# Patient Record
Sex: Male | Born: 1940 | Race: White | Hispanic: No | Marital: Married
Health system: Southern US, Community
[De-identification: ages and names within clinical notes are randomized; demographics above are authoritative.]

## PROBLEM LIST (undated history)

## (undated) DIAGNOSIS — I10 Essential (primary) hypertension: Secondary | ICD-10-CM

## (undated) DIAGNOSIS — E119 Type 2 diabetes mellitus without complications: Secondary | ICD-10-CM

---

## 2007-01-24 ENCOUNTER — Emergency Department: Payer: Self-pay | Admitting: Emergency Medicine

## 2007-01-24 ENCOUNTER — Inpatient Hospital Stay: Payer: Self-pay | Admitting: Internal Medicine

## 2007-01-29 ENCOUNTER — Ambulatory Visit: Payer: Self-pay | Admitting: Surgery

## 2007-02-02 ENCOUNTER — Inpatient Hospital Stay: Payer: Self-pay | Admitting: Internal Medicine

## 2007-02-20 ENCOUNTER — Other Ambulatory Visit: Payer: Self-pay

## 2007-02-20 ENCOUNTER — Inpatient Hospital Stay: Payer: Self-pay | Admitting: Internal Medicine

## 2007-03-24 ENCOUNTER — Ambulatory Visit: Payer: Self-pay | Admitting: Internal Medicine

## 2007-03-31 ENCOUNTER — Ambulatory Visit: Payer: Self-pay | Admitting: General Surgery

## 2007-04-11 ENCOUNTER — Ambulatory Visit: Payer: Self-pay | Admitting: Internal Medicine

## 2007-05-12 ENCOUNTER — Ambulatory Visit: Payer: Self-pay | Admitting: Internal Medicine

## 2007-06-11 ENCOUNTER — Ambulatory Visit: Payer: Self-pay | Admitting: Internal Medicine

## 2007-07-12 ENCOUNTER — Ambulatory Visit: Payer: Self-pay | Admitting: Internal Medicine

## 2007-07-28 ENCOUNTER — Ambulatory Visit: Payer: Self-pay | Admitting: Internal Medicine

## 2007-08-11 ENCOUNTER — Ambulatory Visit: Payer: Self-pay | Admitting: Internal Medicine

## 2007-09-11 ENCOUNTER — Ambulatory Visit: Payer: Self-pay | Admitting: Internal Medicine

## 2007-12-10 ENCOUNTER — Ambulatory Visit: Payer: Self-pay | Admitting: Internal Medicine

## 2008-01-09 ENCOUNTER — Ambulatory Visit: Payer: Self-pay | Admitting: Internal Medicine

## 2008-08-13 IMAGING — CR DG ABDOMEN 3V
1 series · 5 of 5 positions shown · non-contrast
Comparison: none

REASON FOR EXAM: SBO
COMMENTS:

PROCEDURE:     DXR - DXR ABDOMEN 3-WAY (INCL PA CXR)  - January 25, 2007  [DATE]
RESULT:     Comparison to prior report of chest x-ray of 10/05/2000.

[Series 1: view not recorded · 0.17mm/px · 5 of 5 slices shown]
[im 1/5]
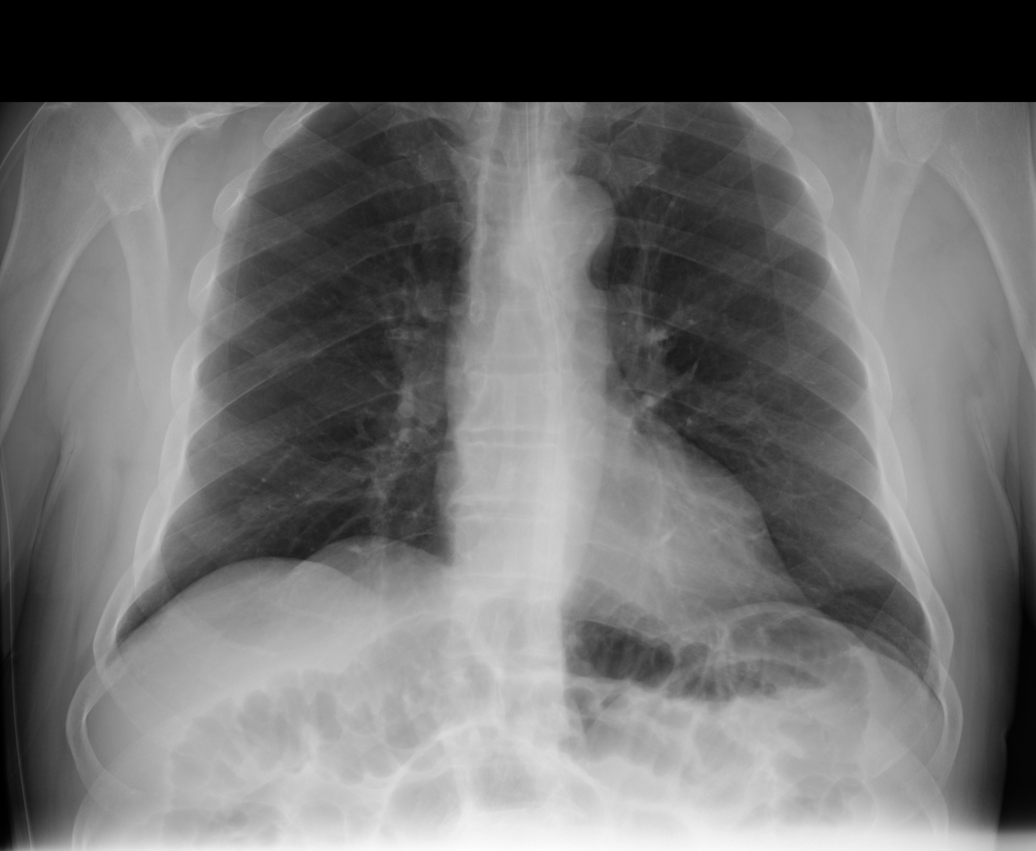
[im 2/5]
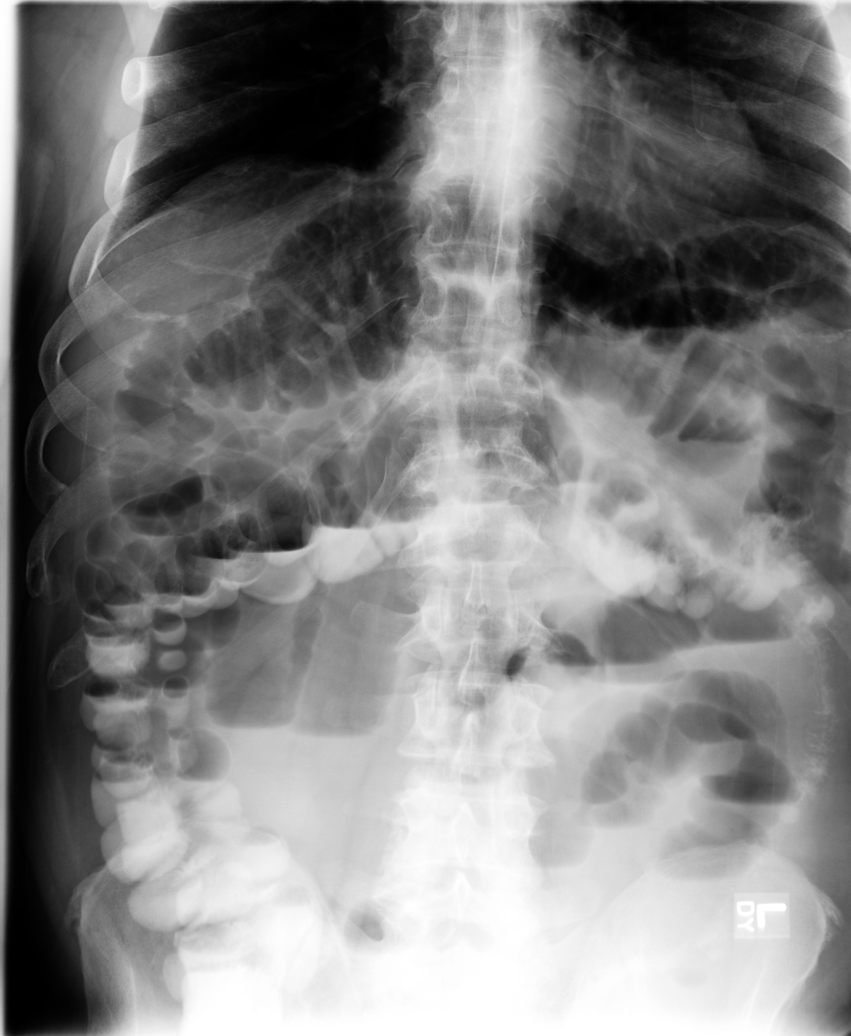
[im 3/5]
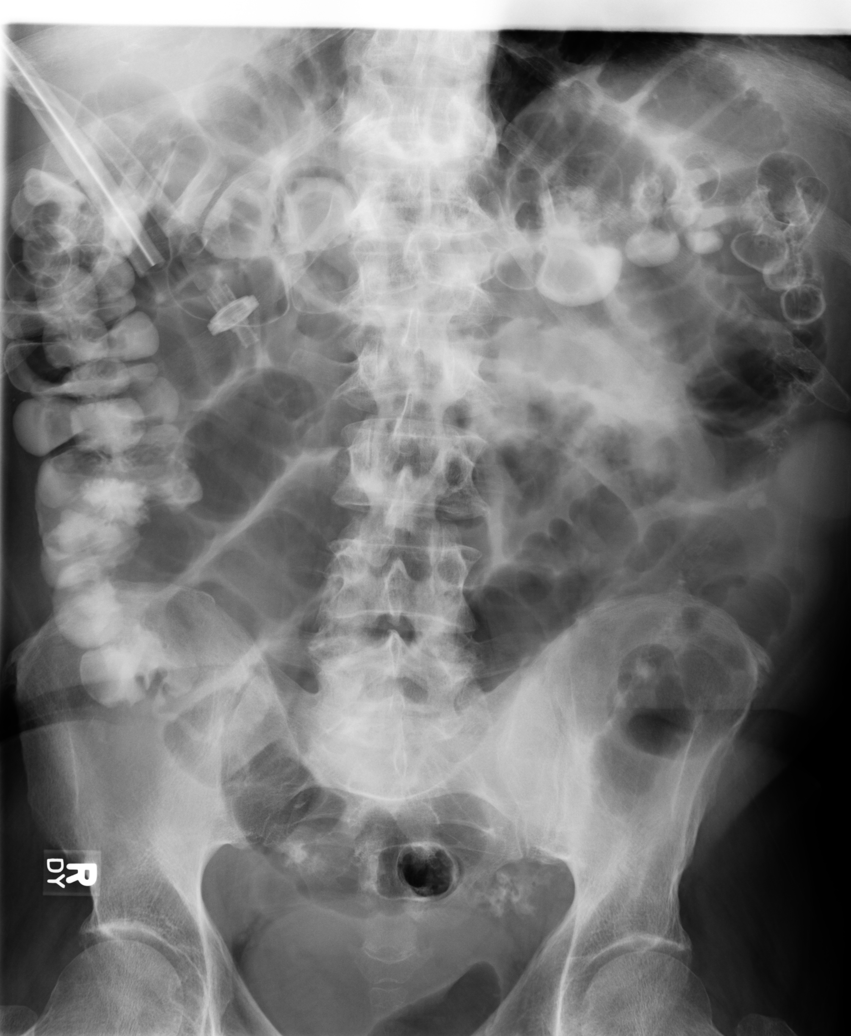
[im 4/5]
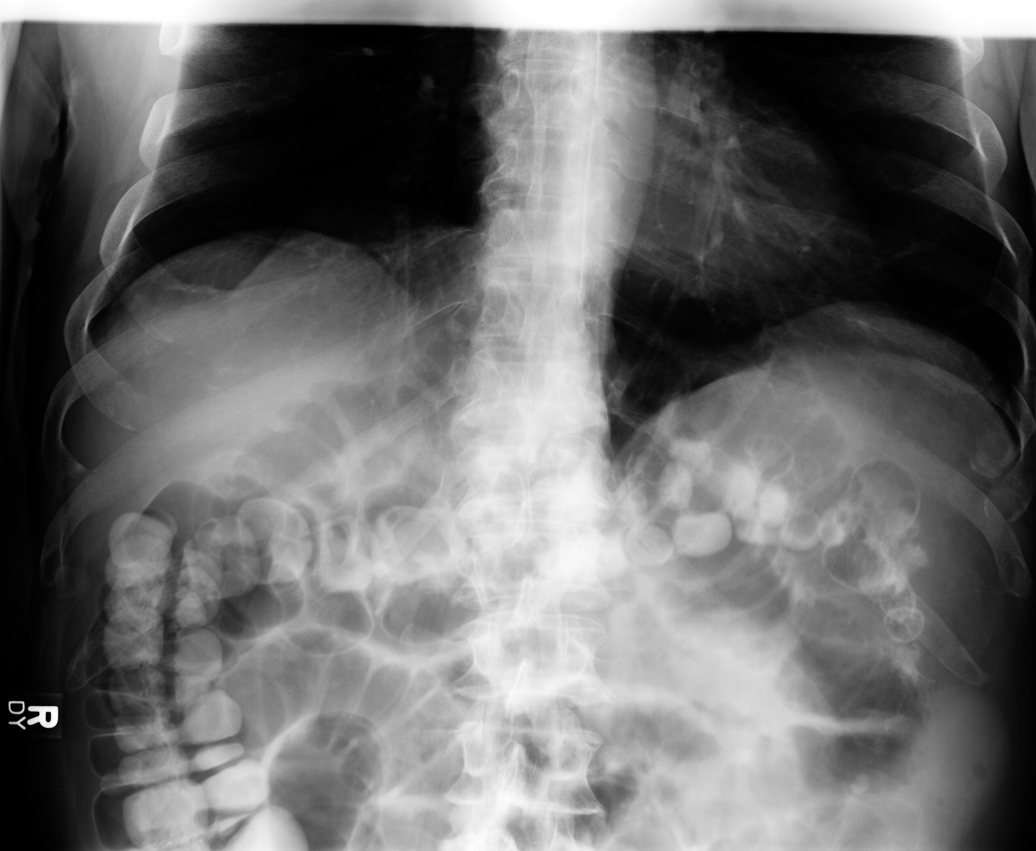
[im 5/5]
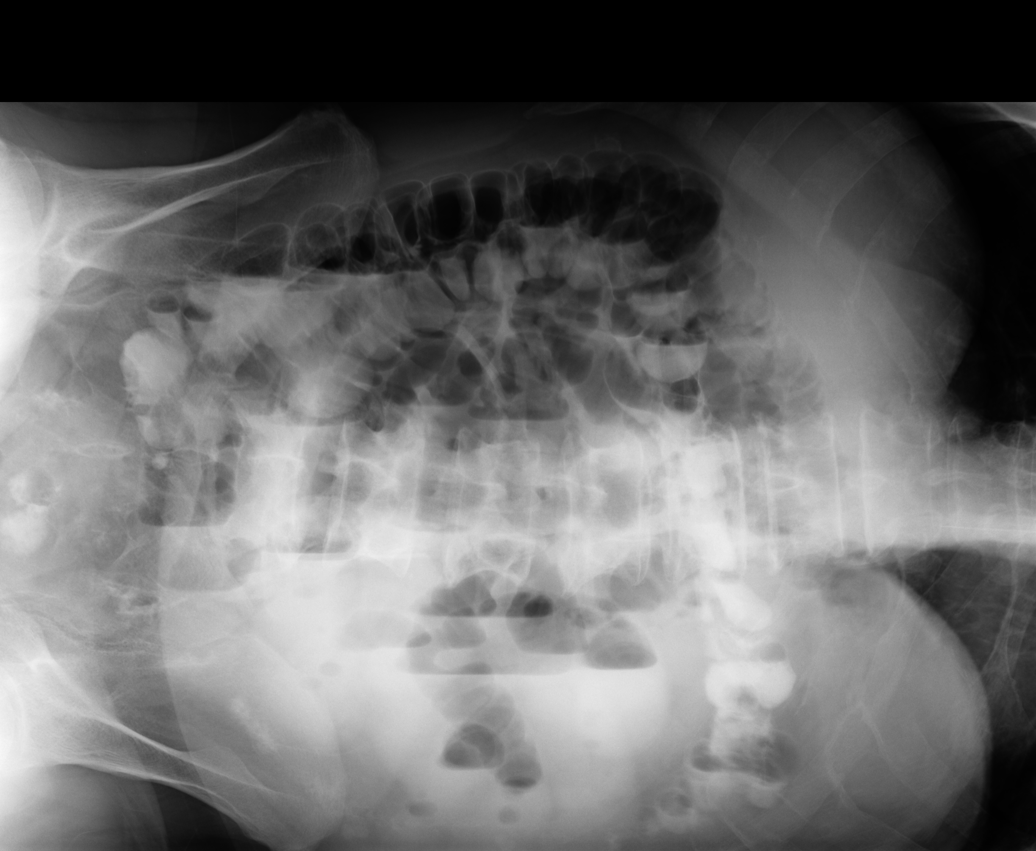

[5 of 5 positions shown; findings below may reference images not displayed]

FINDINGS: The lungs are clear. Tiny mediastinal silhouette is unremarkable.
An NG tube extends into the stomach. There is no pneumothorax. There are
multiple loops of dilated small bowel with relatively decompressed and large
bowel. Air-fluid levels are seen on upright film. There is no evidence of
free air.
IMPRESSION: Findings consistent with persistent small bowel obstruction.

## 2008-12-09 ENCOUNTER — Ambulatory Visit: Payer: Self-pay | Admitting: Vascular Surgery

## 2009-01-12 ENCOUNTER — Ambulatory Visit: Payer: Self-pay | Admitting: Vascular Surgery

## 2009-07-08 ENCOUNTER — Inpatient Hospital Stay: Payer: Self-pay | Admitting: Internal Medicine

## 2010-02-06 ENCOUNTER — Inpatient Hospital Stay: Payer: Self-pay | Admitting: Internal Medicine

## 2010-05-11 ENCOUNTER — Ambulatory Visit: Payer: Self-pay | Admitting: Internal Medicine

## 2010-05-17 ENCOUNTER — Inpatient Hospital Stay: Payer: Self-pay | Admitting: Internal Medicine

## 2010-05-25 ENCOUNTER — Ambulatory Visit: Payer: Self-pay | Admitting: Internal Medicine

## 2010-06-10 ENCOUNTER — Ambulatory Visit: Payer: Self-pay | Admitting: Internal Medicine

## 2010-06-26 ENCOUNTER — Ambulatory Visit: Payer: Self-pay | Admitting: Gastroenterology

## 2010-06-29 LAB — PATHOLOGY REPORT

## 2010-08-28 ENCOUNTER — Other Ambulatory Visit: Payer: Self-pay

## 2011-04-15 ENCOUNTER — Inpatient Hospital Stay: Payer: Self-pay | Admitting: *Deleted

## 2012-04-01 ENCOUNTER — Ambulatory Visit: Payer: Self-pay | Admitting: Emergency Medicine

## 2012-04-01 DIAGNOSIS — I2510 Atherosclerotic heart disease of native coronary artery without angina pectoris: Secondary | ICD-10-CM

## 2012-04-01 LAB — HEPATIC FUNCTION PANEL A (ARMC)
Bilirubin,Total: 0.7 mg/dL (ref 0.2–1.0)
SGOT(AST): 21 U/L (ref 15–37)
SGPT (ALT): 20 U/L
Total Protein: 7.1 g/dL (ref 6.4–8.2)

## 2012-04-01 LAB — CBC WITH DIFFERENTIAL/PLATELET
Basophil %: 1.1 %
Eosinophil #: 0.1 10*3/uL (ref 0.0–0.7)
Eosinophil %: 0.8 %
HCT: 48.2 % (ref 40.0–52.0)
HGB: 15.6 g/dL (ref 13.0–18.0)
Lymphocyte %: 15 %
MCHC: 32.4 g/dL (ref 32.0–36.0)
Monocyte %: 10 %
Neutrophil #: 5.9 10*3/uL (ref 1.4–6.5)
Neutrophil %: 73.1 %
RBC: 5.22 10*6/uL (ref 4.40–5.90)
RDW: 15.1 % — ABNORMAL HIGH (ref 11.5–14.5)

## 2012-04-01 LAB — BASIC METABOLIC PANEL
Anion Gap: 9 (ref 7–16)
BUN: 21 mg/dL — ABNORMAL HIGH (ref 7–18)
Creatinine: 1.35 mg/dL — ABNORMAL HIGH (ref 0.60–1.30)
Glucose: 100 mg/dL — ABNORMAL HIGH (ref 65–99)
Osmolality: 273 (ref 275–301)
Potassium: 4.1 mmol/L (ref 3.5–5.1)
Sodium: 135 mmol/L — ABNORMAL LOW (ref 136–145)

## 2012-04-08 ENCOUNTER — Inpatient Hospital Stay: Payer: Self-pay | Admitting: Emergency Medicine

## 2012-04-08 LAB — PROTIME-INR: INR: 0.9

## 2012-04-11 LAB — PATHOLOGY REPORT

## 2013-09-22 ENCOUNTER — Encounter (HOSPITAL_COMMUNITY)
Admission: EM | Disposition: A | Discharge status: Hospice | Payer: Self-pay | Source: Home / Self Care | Attending: Neurology

## 2013-09-22 ENCOUNTER — Encounter (HOSPITAL_COMMUNITY): Payer: Medicare Other | Admitting: Certified Registered Nurse Anesthetist

## 2013-09-22 ENCOUNTER — Emergency Department (HOSPITAL_COMMUNITY): Payer: Medicare Other

## 2013-09-22 ENCOUNTER — Inpatient Hospital Stay (HOSPITAL_COMMUNITY): Payer: Medicare Other

## 2013-09-22 ENCOUNTER — Inpatient Hospital Stay (HOSPITAL_COMMUNITY)
Admission: EM | Admit: 2013-09-22 | Discharge: 2013-09-26 | DRG: 023 | Disposition: A | Discharge status: Hospice | Payer: Medicare Other | Attending: Neurology | Admitting: Neurology

## 2013-09-22 ENCOUNTER — Encounter (HOSPITAL_COMMUNITY): Payer: Self-pay | Admitting: Emergency Medicine

## 2013-09-22 ENCOUNTER — Encounter (HOSPITAL_COMMUNITY): Payer: Self-pay | Admitting: Certified Registered"

## 2013-09-22 ENCOUNTER — Inpatient Hospital Stay (HOSPITAL_COMMUNITY): Payer: Medicare Other | Admitting: Certified Registered Nurse Anesthetist

## 2013-09-22 DIAGNOSIS — I639 Cerebral infarction, unspecified: Secondary | ICD-10-CM | POA: Diagnosis present

## 2013-09-22 DIAGNOSIS — I6350 Cerebral infarction due to unspecified occlusion or stenosis of unspecified cerebral artery: Secondary | ICD-10-CM

## 2013-09-22 DIAGNOSIS — I4891 Unspecified atrial fibrillation: Secondary | ICD-10-CM | POA: Diagnosis present

## 2013-09-22 DIAGNOSIS — Z66 Do not resuscitate: Secondary | ICD-10-CM | POA: Diagnosis not present

## 2013-09-22 DIAGNOSIS — R4182 Altered mental status, unspecified: Secondary | ICD-10-CM | POA: Diagnosis present

## 2013-09-22 DIAGNOSIS — T4275XA Adverse effect of unspecified antiepileptic and sedative-hypnotic drugs, initial encounter: Secondary | ICD-10-CM | POA: Diagnosis not present

## 2013-09-22 DIAGNOSIS — J438 Other emphysema: Secondary | ICD-10-CM | POA: Diagnosis present

## 2013-09-22 DIAGNOSIS — R4789 Other speech disturbances: Secondary | ICD-10-CM | POA: Diagnosis present

## 2013-09-22 DIAGNOSIS — E871 Hypo-osmolality and hyponatremia: Secondary | ICD-10-CM | POA: Diagnosis not present

## 2013-09-22 DIAGNOSIS — Z7982 Long term (current) use of aspirin: Secondary | ICD-10-CM

## 2013-09-22 DIAGNOSIS — Z79899 Other long term (current) drug therapy: Secondary | ICD-10-CM

## 2013-09-22 DIAGNOSIS — E119 Type 2 diabetes mellitus without complications: Secondary | ICD-10-CM | POA: Diagnosis present

## 2013-09-22 DIAGNOSIS — E785 Hyperlipidemia, unspecified: Secondary | ICD-10-CM | POA: Diagnosis present

## 2013-09-22 DIAGNOSIS — E870 Hyperosmolality and hypernatremia: Secondary | ICD-10-CM | POA: Diagnosis not present

## 2013-09-22 DIAGNOSIS — I10 Essential (primary) hypertension: Secondary | ICD-10-CM | POA: Diagnosis present

## 2013-09-22 DIAGNOSIS — Z515 Encounter for palliative care: Secondary | ICD-10-CM

## 2013-09-22 DIAGNOSIS — J9600 Acute respiratory failure, unspecified whether with hypoxia or hypercapnia: Secondary | ICD-10-CM | POA: Diagnosis present

## 2013-09-22 DIAGNOSIS — I6340 Cerebral infarction due to embolism of unspecified cerebral artery: Principal | ICD-10-CM | POA: Diagnosis present

## 2013-09-22 DIAGNOSIS — I63239 Cerebral infarction due to unspecified occlusion or stenosis of unspecified carotid arteries: Secondary | ICD-10-CM | POA: Diagnosis present

## 2013-09-22 DIAGNOSIS — R414 Neurologic neglect syndrome: Secondary | ICD-10-CM | POA: Diagnosis present

## 2013-09-22 DIAGNOSIS — G8190 Hemiplegia, unspecified affecting unspecified side: Secondary | ICD-10-CM | POA: Diagnosis present

## 2013-09-22 DIAGNOSIS — G936 Cerebral edema: Secondary | ICD-10-CM | POA: Diagnosis present

## 2013-09-22 HISTORY — DX: Type 2 diabetes mellitus without complications: E11.9

## 2013-09-22 HISTORY — PX: RADIOLOGY WITH ANESTHESIA: SHX6223

## 2013-09-22 HISTORY — DX: Essential (primary) hypertension: I10

## 2013-09-22 LAB — URINALYSIS, ROUTINE W REFLEX MICROSCOPIC
Bilirubin Urine: NEGATIVE
Glucose, UA: NEGATIVE mg/dL
Hgb urine dipstick: NEGATIVE
Ketones, ur: NEGATIVE mg/dL
LEUKOCYTES UA: NEGATIVE
Nitrite: NEGATIVE
PROTEIN: 100 mg/dL — AB
Specific Gravity, Urine: 1.015 (ref 1.005–1.030)
UROBILINOGEN UA: 1 mg/dL (ref 0.0–1.0)
pH: 6.5 (ref 5.0–8.0)

## 2013-09-22 LAB — RAPID URINE DRUG SCREEN, HOSP PERFORMED
Amphetamines: NOT DETECTED
BARBITURATES: NOT DETECTED
Benzodiazepines: NOT DETECTED
Cocaine: NOT DETECTED
Opiates: NOT DETECTED
TETRAHYDROCANNABINOL: NOT DETECTED

## 2013-09-22 LAB — POCT I-STAT, CHEM 8
BUN: 11 mg/dL (ref 6–23)
CALCIUM ION: 1.2 mmol/L (ref 1.13–1.30)
Chloride: 98 mEq/L (ref 96–112)
Creatinine, Ser: 1.2 mg/dL (ref 0.50–1.35)
GLUCOSE: 112 mg/dL — AB (ref 70–99)
HEMATOCRIT: 52 % (ref 39.0–52.0)
Hemoglobin: 17.7 g/dL — ABNORMAL HIGH (ref 13.0–17.0)
Potassium: 4.3 mEq/L (ref 3.7–5.3)
Sodium: 135 mEq/L — ABNORMAL LOW (ref 137–147)
TCO2: 28 mmol/L (ref 0–100)

## 2013-09-22 LAB — CBC
HCT: 46.8 % (ref 39.0–52.0)
Hemoglobin: 16.6 g/dL (ref 13.0–17.0)
MCH: 31.3 pg (ref 26.0–34.0)
MCHC: 35.5 g/dL (ref 30.0–36.0)
MCV: 88.1 fL (ref 78.0–100.0)
PLATELETS: 259 10*3/uL (ref 150–400)
RBC: 5.31 MIL/uL (ref 4.22–5.81)
RDW: 15 % (ref 11.5–15.5)
WBC: 9.7 10*3/uL (ref 4.0–10.5)

## 2013-09-22 LAB — COMPREHENSIVE METABOLIC PANEL
ALBUMIN: 3.8 g/dL (ref 3.5–5.2)
ALK PHOS: 82 U/L (ref 39–117)
ALT: 9 U/L (ref 0–53)
AST: 18 U/L (ref 0–37)
BILIRUBIN TOTAL: 0.4 mg/dL (ref 0.3–1.2)
BUN: 11 mg/dL (ref 6–23)
CO2: 28 mEq/L (ref 19–32)
Calcium: 9.5 mg/dL (ref 8.4–10.5)
Chloride: 95 mEq/L — ABNORMAL LOW (ref 96–112)
Creatinine, Ser: 1.03 mg/dL (ref 0.50–1.35)
GFR calc Af Amer: 81 mL/min — ABNORMAL LOW (ref >90)
GFR calc non Af Amer: 70 mL/min — ABNORMAL LOW (ref >90)
Glucose, Bld: 111 mg/dL — ABNORMAL HIGH (ref 70–99)
POTASSIUM: 4.4 meq/L (ref 3.7–5.3)
SODIUM: 133 meq/L — AB (ref 137–147)
TOTAL PROTEIN: 7.1 g/dL (ref 6.0–8.3)

## 2013-09-22 LAB — BLOOD GAS, ARTERIAL
Acid-base deficit: 3.8 mmol/L — ABNORMAL HIGH (ref 0.0–2.0)
Bicarbonate: 20.5 mEq/L (ref 20.0–24.0)
Drawn by: 36274
FIO2: 0.4 %
LHR: 14 {breaths}/min
MECHVT: 560 mL
O2 Saturation: 96.7 %
PATIENT TEMPERATURE: 98.6
PEEP: 5 cmH2O
TCO2: 21.5 mmol/L (ref 0–100)
pCO2 arterial: 35.7 mmHg (ref 35.0–45.0)
pH, Arterial: 7.376 (ref 7.350–7.450)
pO2, Arterial: 87.6 mmHg (ref 80.0–100.0)

## 2013-09-22 LAB — DIFFERENTIAL
BASOS PCT: 1 % (ref 0–1)
Basophils Absolute: 0.1 10*3/uL (ref 0.0–0.1)
Eosinophils Absolute: 0.1 10*3/uL (ref 0.0–0.7)
Eosinophils Relative: 1 % (ref 0–5)
Lymphocytes Relative: 28 % (ref 12–46)
Lymphs Abs: 2.7 10*3/uL (ref 0.7–4.0)
Monocytes Absolute: 1.1 10*3/uL — ABNORMAL HIGH (ref 0.1–1.0)
Monocytes Relative: 11 % (ref 3–12)
NEUTROS PCT: 60 % (ref 43–77)
Neutro Abs: 5.8 10*3/uL (ref 1.7–7.7)

## 2013-09-22 LAB — PROTIME-INR
INR: 0.98 (ref 0.00–1.49)
Prothrombin Time: 12.8 seconds (ref 11.6–15.2)

## 2013-09-22 LAB — GLUCOSE, CAPILLARY
GLUCOSE-CAPILLARY: 110 mg/dL — AB (ref 70–99)
Glucose-Capillary: 136 mg/dL — ABNORMAL HIGH (ref 70–99)

## 2013-09-22 LAB — ETHANOL: Alcohol, Ethyl (B): <11 mg/dL (ref 0–11)

## 2013-09-22 LAB — MRSA PCR SCREENING: MRSA BY PCR: NEGATIVE

## 2013-09-22 LAB — URINE MICROSCOPIC-ADD ON

## 2013-09-22 LAB — TROPONIN I: Troponin I: <0.3 ng/mL (ref <0.30)

## 2013-09-22 LAB — POCT I-STAT TROPONIN I: TROPONIN I, POC: 0.01 ng/mL (ref 0.00–0.08)

## 2013-09-22 LAB — APTT: APTT: 29 s (ref 24–37)

## 2013-09-22 SURGERY — RADIOLOGY WITH ANESTHESIA
Anesthesia: General

## 2013-09-22 MED ORDER — ROCURONIUM BROMIDE 100 MG/10ML IV SOLN
INTRAVENOUS | Status: DC | PRN
  Administered 2013-09-22: 20 mg via INTRAVENOUS
  Administered 2013-09-22: 50 mg via INTRAVENOUS
  Administered 2013-09-22: 30 mg via INTRAVENOUS

## 2013-09-22 MED ORDER — ACETAMINOPHEN 325 MG PO TABS: 650.0000 mg | ORAL_TABLET | ORAL | Status: DC | PRN

## 2013-09-22 MED ORDER — PHENYLEPHRINE HCL 10 MG/ML IJ SOLN
INTRAMUSCULAR | Status: DC | PRN
  Administered 2013-09-22: 40 ug via INTRAVENOUS

## 2013-09-22 MED ORDER — SODIUM CHLORIDE 0.9 % IV SOLN
INTRAVENOUS | Status: DC | PRN
  Administered 2013-09-22: 18:00:00 via INTRAVENOUS

## 2013-09-22 MED ORDER — ACETAMINOPHEN 650 MG RE SUPP: 650.0000 mg | RECTAL | Status: DC | PRN

## 2013-09-22 MED ORDER — PHENYLEPHRINE HCL 10 MG/ML IJ SOLN
10.0000 mg | INTRAVENOUS | Status: DC | PRN
  Administered 2013-09-22: 10 ug/min via INTRAVENOUS

## 2013-09-22 MED ORDER — FENTANYL CITRATE 0.05 MG/ML IJ SOLN
INTRAMUSCULAR | Status: DC | PRN
  Administered 2013-09-22: 25 ug via INTRAVENOUS

## 2013-09-22 MED ORDER — ACETAMINOPHEN 650 MG RE SUPP: 650.0000 mg | Freq: Four times a day (QID) | RECTAL | Status: DC | PRN

## 2013-09-22 MED ORDER — FENTANYL CITRATE 0.05 MG/ML IJ SOLN
25.0000 ug | INTRAMUSCULAR | Status: DC | PRN
  Administered 2013-09-22 – 2013-09-23 (×4): 50 ug via INTRAVENOUS
  Administered 2013-09-23: 100 ug via INTRAVENOUS
  Administered 2013-09-23: 50 ug via INTRAVENOUS
  Administered 2013-09-24: 100 ug via INTRAVENOUS
  Administered 2013-09-24: 50 ug via INTRAVENOUS
  Filled 2013-09-22 (×7): qty 2

## 2013-09-22 MED ORDER — EPHEDRINE SULFATE 50 MG/ML IJ SOLN
INTRAMUSCULAR | Status: DC | PRN
  Administered 2013-09-22: 5 mg via INTRAVENOUS

## 2013-09-22 MED ORDER — FENTANYL CITRATE 0.05 MG/ML IJ SOLN
INTRAMUSCULAR | Status: AC
  Filled 2013-09-22: qty 2

## 2013-09-22 MED ORDER — NICARDIPINE HCL IN NACL 20-0.86 MG/200ML-% IV SOLN: 5.0000 mg/h | INTRAVENOUS | Status: DC

## 2013-09-22 MED ORDER — FENTANYL CITRATE 0.05 MG/ML IJ SOLN
INTRAMUSCULAR | Status: DC | PRN
  Administered 2013-09-22: 100 ug via INTRAVENOUS
  Administered 2013-09-22: 50 ug via INTRAVENOUS
  Administered 2013-09-22: 100 ug via INTRAVENOUS

## 2013-09-22 MED ORDER — SODIUM CHLORIDE 0.9 % IV SOLN
INTRAVENOUS | Status: DC
  Administered 2013-09-22: 21:00:00 via INTRAVENOUS

## 2013-09-22 MED ORDER — MIDAZOLAM HCL 2 MG/2ML IJ SOLN
INTRAMUSCULAR | Status: AC
Start: 2013-09-22 — End: 2013-09-23
  Filled 2013-09-22: qty 2

## 2013-09-22 MED ORDER — LABETALOL HCL 5 MG/ML IV SOLN: 10.0000 mg | INTRAVENOUS | Status: DC | PRN

## 2013-09-22 MED ORDER — SODIUM CHLORIDE 0.9 % IJ SOLN
1.5000 mg | INTRAMUSCULAR | Status: AC
  Administered 2013-09-22: 25 ug via INTRA_ARTERIAL

## 2013-09-22 MED ORDER — SODIUM CHLORIDE 0.9 % IV SOLN: INTRAVENOUS | Status: DC

## 2013-09-22 MED ORDER — PANTOPRAZOLE SODIUM 40 MG IV SOLR
40.0000 mg | Freq: Every day | INTRAVENOUS | Status: DC
  Administered 2013-09-22 – 2013-09-23 (×2): 40 mg via INTRAVENOUS
  Filled 2013-09-22 (×4): qty 40

## 2013-09-22 MED ORDER — MIDAZOLAM HCL 2 MG/2ML IJ SOLN
INTRAMUSCULAR | Status: DC | PRN
  Administered 2013-09-22: 1 mg via INTRAVENOUS

## 2013-09-22 MED ORDER — LIDOCAINE HCL (CARDIAC) 20 MG/ML IV SOLN
INTRAVENOUS | Status: DC | PRN
  Administered 2013-09-22: 80 mg via INTRAVENOUS

## 2013-09-22 MED ORDER — BIOTENE DRY MOUTH MT LIQD
15.0000 mL | Freq: Two times a day (BID) | OROMUCOSAL | Status: DC
  Administered 2013-09-23 (×2): 15 mL via OROMUCOSAL

## 2013-09-22 MED ORDER — CHLORHEXIDINE GLUCONATE 0.12 % MT SOLN
15.0000 mL | Freq: Two times a day (BID) | OROMUCOSAL | Status: DC
  Administered 2013-09-23 (×3): 15 mL via OROMUCOSAL
  Filled 2013-09-22 (×3): qty 15

## 2013-09-22 MED ORDER — LABETALOL HCL 5 MG/ML IV SOLN
INTRAVENOUS | Status: AC
  Filled 2013-09-22: qty 4

## 2013-09-22 MED ORDER — ACETAMINOPHEN 500 MG PO TABS
1000.0000 mg | ORAL_TABLET | Freq: Four times a day (QID) | ORAL | Status: DC | PRN
Start: 2013-09-22 — End: 2013-09-24

## 2013-09-22 MED ORDER — PROPOFOL INFUSION 10 MG/ML OPTIME
INTRAVENOUS | Status: DC | PRN
  Administered 2013-09-22: 50 ug/kg/min via INTRAVENOUS

## 2013-09-22 MED ORDER — PROPOFOL 10 MG/ML IV BOLUS
INTRAVENOUS | Status: DC | PRN
  Administered 2013-09-22: 130 mg via INTRAVENOUS

## 2013-09-22 MED ORDER — IOHEXOL 300 MG/ML  SOLN
150.0000 mL | Freq: Once | INTRAMUSCULAR | Status: AC | PRN
  Administered 2013-09-22: 140 mL via INTRA_ARTERIAL

## 2013-09-22 MED ORDER — ALTEPLASE 30 MG/30 ML FOR INTERV. RAD
1.0000 mg | INTRA_ARTERIAL | Status: AC
  Administered 2013-09-22: 6.9 mg via INTRA_ARTERIAL
  Administered 2013-09-22: 2.5 mg via INTRA_ARTERIAL
  Administered 2013-09-22: 1 mg via INTRA_ARTERIAL
  Filled 2013-09-22: qty 30

## 2013-09-22 MED ORDER — STROKE: EARLY STAGES OF RECOVERY BOOK
Freq: Once | Status: AC
  Administered 2013-09-22: 23:00:00
  Filled 2013-09-22: qty 1

## 2013-09-22 MED ORDER — ALTEPLASE (STROKE) FULL DOSE INFUSION
0.9000 mg/kg | Freq: Once | INTRAVENOUS | Status: AC
  Administered 2013-09-22: 59 mg via INTRAVENOUS
  Filled 2013-09-22: qty 59

## 2013-09-22 MED ORDER — SODIUM CHLORIDE 0.9 % IV SOLN
Freq: Once | INTRAVENOUS | Status: AC
  Administered 2013-09-22: 75 mL/h via INTRAVENOUS

## 2013-09-22 MED ORDER — ONDANSETRON HCL 4 MG/2ML IJ SOLN
4.0000 mg | Freq: Four times a day (QID) | INTRAMUSCULAR | Status: DC | PRN
  Filled 2013-09-22: qty 2

## 2013-09-22 MED ORDER — SUCCINYLCHOLINE CHLORIDE 20 MG/ML IJ SOLN
INTRAMUSCULAR | Status: DC | PRN
  Administered 2013-09-22: 60 mg via INTRAVENOUS

## 2013-09-22 NOTE — ED Notes (Addendum)
recannulization of MCA

## 2013-09-22 NOTE — ED Notes (Signed)
Per EMS, family was with patient at [id number] when he started acting funny. Gave cookie because pt is diabetic. No change with patient and called EMS. Pt. Left sided weakness, difficulty with speech. CBG 91.

## 2013-09-22 NOTE — ED Notes (Signed)
1st puncture 

## 2013-09-22 NOTE — ED Notes (Signed)
Pt. Brother, Renae Fickleaul, called and talked with neurologist

## 2013-09-22 NOTE — ED Notes (Signed)
TPA bolus complete NS running

## 2013-09-22 NOTE — Code Documentation (Signed)
72[facility] at [phone].  EMS reports that the patient was with a family member when he began to act "funny" at 1445.  Family member gave the patient a cookie because he is diabetic.  After no improvement, the family member activated EMS.  [facility]. EMS reports left hemiparesis.  Patient with a history of DM and HTN per EMS.  Patient taken to CT on arrival.  CT reviewed by neurologist.  NIHSS 19 on arrival, see documentation for details.  Patient with continued left hemiparesis, aphasia and slurred speech.  Dr. Roseanne RenoStewart at bedside and contacted family via phone.  Decision made to proceed with tPA.  PIV and foley catheter placed.  tPA started at 1623 per pharmacy dosing.  IR notified and patient to IR at 1648.  See code stroke log for times and documentation.  Bedside handoff completed with IR RN Peggy.

## 2013-09-22 NOTE — ED Notes (Signed)
Recannulization ACA

## 2013-09-22 NOTE — Transfer of Care (Signed)
Immediate Anesthesia Transfer of Care Note  Patient: Randy Taylor  Procedure(s) Performed: * No procedures listed *  Patient Location: NICU  Anesthesia Type:General  Level of Consciousness: Patient remains intubated per anesthesia plan  Airway & Oxygen Therapy: Patient remains intubated per anesthesia plan and Patient placed on Ventilator (see vital sign flow sheet for setting)  Post-op Assessment: Report given to PACU RN  Post vital signs: Reviewed  Complications: No apparent anesthesia complications

## 2013-09-22 NOTE — ED Notes (Signed)
Care passed to anesthesia

## 2013-09-22 NOTE — ED Notes (Signed)
Neurologist and rapid response nurses at bedside

## 2013-09-22 NOTE — Anesthesia Preprocedure Evaluation (Signed)
Anesthesia Evaluation  Patient identified by MRN, date of birth, ID band  Airway       Dental   Pulmonary          Cardiovascular hypertension,     Neuro/Psych    GI/Hepatic   Endo/Other  diabetes  Renal/GU      Musculoskeletal   Abdominal   Peds  Hematology   Anesthesia Other Findings   Reproductive/Obstetrics                           Anesthesia Physical Anesthesia Plan  ASA: V and emergent  Anesthesia Plan: General   Post-op Pain Management:    Induction: Intravenous  Airway Management Planned: Oral ETT  Additional Equipment: Arterial line  Intra-op Plan:   Post-operative Plan: Possible Post-op intubation/ventilation  Informed Consent:   Plan Discussed with:   Anesthesia Plan Comments:         Anesthesia Quick Evaluation

## 2013-09-22 NOTE — Anesthesia Postprocedure Evaluation (Signed)
  Anesthesia Post-op Note  Patient: Randy Taylor  Procedure(s) Performed: * No procedures listed *  Patient Location: NICU  Anesthesia Type:General  Level of Consciousness: Patient remains intubated per anesthesia plan  Airway and Oxygen Therapy: Patient remains intubated per anesthesia plan and Patient placed on Ventilator (see vital sign flow sheet for setting)  Post-op Pain: none  Post-op Assessment: Post-op Vital signs reviewed  Post-op Vital Signs: Reviewed  Complications: No apparent anesthesia complications

## 2013-09-22 NOTE — Procedures (Signed)
S/P rt common carotid and Rt vert artery arteriograms,followed by complete TICI3 revascularization of occluded RT MCA using 13..4 mg of IA TPA and x1 pass with the SOLITAIRE FR 4mm x 40 mm  Retrieval device..Marland Kitchen

## 2013-09-22 NOTE — Anesthesia Procedure Notes (Signed)
Procedure Name: Intubation Date/Time: 09/22/2013 5:53 PM Performed by: Angelica PouSMITH, Faven Watterson PIZZICARA Pre-anesthesia Checklist: Patient identified, Patient being monitored, Emergency Drugs available, Timeout performed and Suction available Patient Re-evaluated:Patient Re-evaluated prior to inductionOxygen Delivery Method: Circle system utilized Preoxygenation: Pre-oxygenation with 100% oxygen Intubation Type: IV induction, Rapid sequence and Cricoid Pressure applied Laryngoscope Size: Mac and 3 Grade View: Grade I Tube type: Subglottic suction tube Tube size: 7.5 mm Number of attempts: 1 Airway Equipment and Method: Stylet Placement Confirmation: ETT inserted through vocal cords under direct vision,  breath sounds checked- equal and bilateral and positive ETCO2 Secured at: 23 cm Tube secured with: Tape Dental Injury: Teeth and Oropharynx as per pre-operative assessment  Comments: Smooth IV induction by Dr Maple HudsonMoser; easy ATOI by Pasty ArchJ Myrakle Wingler CRNA.

## 2013-09-22 NOTE — ED Notes (Signed)
Foley placed prior to TPA administration.

## 2013-09-22 NOTE — Anesthesia Postprocedure Evaluation (Signed)
  Anesthesia Post-op Note  Patient: Randy Taylor  Procedure(s) Performed: * No procedures listed *  Patient Location: PACU and NICU  Anesthesia Type:General  Level of Consciousness: sedated  Airway and Oxygen Therapy: Patient remains intubated per anesthesia plan  Post-op Pain: mild  Post-op Assessment: Post-op Vital signs reviewed  Post-op Vital Signs: Reviewed  Complications: No apparent anesthesia complications

## 2013-09-22 NOTE — Consult Note (Signed)
Name: Randy Taylor MRN: [id number] DOB: August 10, 1941    ADMISSION DATE:  09/22/2013 CONSULTATION DATE:  09/22/13  REFERRING MD :  Dr. Roseanne Reno PRIMARY SERVICE:  Neurology  CHIEF COMPLAINT:  Slurred speech, left-sided weakness  BRIEF PATIENT DESCRIPTION: The patient is a 73 yo man, history of HTN, DM, presenting 1/13 with slurred speech, left-sided weakness, and left-sided neglect, found to have R MCA occlusion by CT and angiogram, given TPA and taken to IR for TPA and clot retrieval.  SIGNIFICANT EVENTS / STUDIES:  1/13 - admitted for acute R MCA stroke, s/p TPA and IR clot retrieval  LINES / TUBES: ETT 1/13 >> Foley 1/13>>  CULTURES: None  ANTIBIOTICS: None  HISTORY OF PRESENT ILLNESS:  The patient is a 73 yo man, history of HTN, DM, who takes daily aspirin.  At 2:45 pm on 1/13, he developed acute onset of slurred speech, left arm and leg weakness, and left sided neglect.  He was brought to [facility], with an NIHSS = 19.  CT showed a right proximal MCA territory density, and follow-up angiogram confirmed an occlusion in the R proximal MCA.  The patient was given TPA, then taken to IR for procedure, where he got local TPA and clot retrieval.  The patient is currently intubated, sedated, so history was obtained via chart review.  PAST MEDICAL HISTORY :  Past Medical History  Diagnosis Date  . Diabetes mellitus without complication   . Hypertension    History reviewed. No pertinent past surgical history. Prior to Admission medications   Not on File   Allergies not on file  FAMILY HISTORY:  No family history on file. SOCIAL HISTORY:  has no tobacco, alcohol, and drug history on file.  REVIEW OF SYSTEMS:   Level 5 caveat applies, pt intubated, sedated  SUBJECTIVE:   VITAL SIGNS: Temp:  [96.8 F (36 C)-96.9 F (36.1 C)] 96.8 F (36 C) (01/13 1630) Pulse Rate:  [48-110] 102 (01/13 1740) Resp:  [16-21] 16 (01/13 1735) BP: (127-166)/(58-139) 160/58 mmHg (01/13  1740) SpO2:  [98 %-100 %] 98 % (01/13 1735) FiO2 (%):  [100 %] 100 % (01/13 1959) Weight:  [144 lb 6.4 oz (65.499 kg)-144 lb 6.4 oz (65.5 kg)] 144 lb 6.4 oz (65.499 kg) (01/13 1611) HEMODYNAMICS:   VENTILATOR SETTINGS: Vent Mode:  [-] PRVC FiO2 (%):  [100 %] 100 % Set Rate:  [14 bmp] 14 bmp Vt Set:  [550 mL] 550 mL PEEP:  [5 cmH20] 5 cmH20 Plateau Pressure:  [12 cmH20] 12 cmH20 INTAKE / OUTPUT: Intake/Output     01/13 0701 - 01/14 0700   I.V. (mL/kg) 500 (7.6)   Total Intake(mL/kg) 500 (7.6)   Urine (mL/kg/hr) 800   Total Output 800   Net -300         PHYSICAL EXAMINATION: General: Intubated, sedated HEENT: pupils constricted but reactive to light, ETT in place Neck: supple, no bruits ascultated Lungs: mechanical breath sounds, no wheezes, rales, or ronchi Heart: distant heart sounds, irregularly irregular rhythm, regular rate, no murmurs Abdomen: soft, non-tender, non-distended, hypoactive bowel sounds Extremities: no cyanosis, clubbing, or edema Neurologic: sedated, RASS = -5 on propofol 25, not following commands   LABS:  CBC  Recent Labs Lab 09/22/13 1600 09/22/13 1611  WBC 9.7  --   HGB 16.6 17.7*  HCT 46.8 52.0  PLT 259  --    Coag's  Recent Labs Lab 09/22/13 1600  APTT 29  INR 0.98   BMET  Recent Labs Lab  09/22/13 1600 09/22/13 1611  NA 133* 135*  K 4.4 4.3  CL 95* 98  CO2 28  --   BUN 11 11  CREATININE 1.03 1.20  GLUCOSE 111* 112*   Electrolytes  Recent Labs Lab 09/22/13 1600  CALCIUM 9.5   Sepsis Markers No results found for this basename: LATICACIDVEN, PROCALCITON, O2SATVEN,  in the last 168 hours ABG No results found for this basename: PHART, PCO2ART, PO2ART,  in the last 168 hours Liver Enzymes  Recent Labs Lab 09/22/13 1600  AST 18  ALT 9  ALKPHOS 82  BILITOT 0.4  ALBUMIN 3.8   Cardiac Enzymes  Recent Labs Lab 09/22/13 1600  TROPONINI <0.30   Glucose  Recent Labs Lab 09/22/13 1612  GLUCAP 110*     Imaging Ct Head Wo Contrast  09/22/2013   CLINICAL DATA:  Status post t-PA. Status post right MCA embolectomy. Stroke risk factors include diabetes and hypertension. Left-sided weakness.  EXAM: CT HEAD WITHOUT CONTRAST  TECHNIQUE: Contiguous axial images were obtained from the base of the skull through the vertex without contrast.  COMPARISON:  Preprocedure CT head at 1402 hr.  FINDINGS: The patient is status post cerebral angiography with revascularization. Vascular opacification proximally with apparent gross patency of both internal carotid arteries and both middle cerebral arteries on this non CTA examination.  Postcontrast enhancement and/or microhemorrhage affecting the right frontal and temporal opercular cortex, right insular cortex, and putamen, as well as the periventricular white matter on the right. It is unclear if this represents pooling of contrast secondary to disruption of the blood-brain barrier or early microhemorrhage post t-PA, or a combination of both. Repeat CT scan 12 hr from without contrast should help differentiate.  No subarachnoid or intraventricular blood. No significant edema or midline shift. Normal-appearing left hemisphere and posterior fossa structures. Unchanged calvarium and extracranial soft tissues.  IMPRESSION: Postcontrast enhancement and/or microhemorrhage affecting the right hemisphere cortex, white matter, and basal ganglia structures subserved by the right MCA vascular territory. Gross patency of the right anterior circulation has been re-established.  No significant mass effect or midline shift. Continued surveillance is warranted.   Electronically Signed   By: Davonna Belling M.D.   On: 09/22/2013 20:09   Ct Head Wo Contrast  09/22/2013   CLINICAL DATA:  Left-sided weakness.  EXAM: CT HEAD WITHOUT CONTRAST  TECHNIQUE: Contiguous axial images were obtained from the base of the skull through the vertex without intravenous contrast.  COMPARISON:  None.  FINDINGS:  Slightly motion degraded exam.  No intracranial hemorrhage.  Dense appearance of the M1 segment of the right middle cerebral artery. This may indicate thrombus or be normal.  Small vessel disease type changes.  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  No intracranial mass lesion noted on this unenhanced exam.  IMPRESSION: Slightly motion degraded exam.  No intracranial hemorrhage.  Dense appearance of the M1 segment of the right middle cerebral artery. This may indicate thrombus or be normal (only minimally different than the left side).  Small vessel disease type changes.  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  These results were called by telephone at the time of interpretation on 09/22/2013 at 4:10 PM to Dr. Joelene Millin who verbally acknowledged these results.   Electronically Signed   By: Bridgett Larsson M.D.   On: 09/22/2013 16:25    CXR: None  ASSESSMENT / PLAN:  PULMONARY  A: S/p Revasc of Rt MCA- Intubated, Respiratory failure possibly due to sedation.  P:  - Chest Xray in the morning.  -  Continue full vent support overnight  - SBT in AM, with plans to likely extubate tomorrow if ok with neuro and meets criteria. - Fentanyl, versed for sedation, avoid continuous sedation for now.  CARDIOVASCULAR  A: HTN  Atria Fibrillation- currently Rate controlled, ?new diagnosis P:  - Goal BP- Sys- 110-130. - PRN Labetalol- 10mg  Q4010minn PRN. - If HR increases >110 and blood pressure will tolerate, Nicardipine- 5-15mg /hr-, BP responds nicely to sedation however. - Repeat EKG in AM.  - Carotid dopplers.  - F/u on Echo.  - F/u on Lipid Panel   RENAL  A: Hyponatremia.  P:  - Follow up on Bmet.  - Replace electrolytes as needed. - Continue IVF for now.  GASTROINTESTINAL  A: Nutrition SUP P:  - Pantoprazole- 40mg  daily.  - Zofran- 4mg  Q6H.  - Consider tube feeds tomorrow, if not extubated tomorrow.   HEMATOLOGIC  A: None.  P:  - SCDs, for now, no Anticoag.   INFECTIOUS  A: None.  P:  -  Monitor fever curve and WBC.  ENDOCRINE  A: Diabetes Mellitus  P:  - CBGs- Q4H, can add SSI if elevated. - HBA1c.  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 new afib as cause of CVA? P:  - Fentanyl and versed for sedation. - Neuro Checks Q2h.  - Neurology following.  TODAY'S SUMMARY: Presented with slurred speech and left hemiplegia, consistent with MCA occlyusion as confirmed by Cerebral angiogram. TPA administered by IR.  Janalyn Harderyan Brown, PGY3 Pgr. [phone] 09/22/2013, 8:34 PM  Will maintain sedated and intubated overnight specially with sheaths in place.  Once that is resolved then will consider extubation assuming no further need for neurologic interventions.  I have personally obtained a history, examined the patient, evaluated laboratory and imaging results, formulated the assessment and plan and placed orders.  CRITICAL CARE: The patient is critically ill with multiple organ systems failure and requires high complexity decision making for assessment and support, frequent evaluation and titration of therapies, application of advanced monitoring technologies and extensive interpretation of multiple databases. Critical Care Time devoted to patient care services described in this note is 45 minutes.   Alyson ReedyWesam G. Darris Staiger, M.D. [facility]. Pager: [phone]. After hours pager: [phone].

## 2013-09-22 NOTE — H&P (Signed)
Admission H&P    Chief Complaint: Acute onset slurred speech and left hemiplegia.  HPI: Randy Taylor is an 73 y.o. male with a history of hypertension and diabetes mellitus who experienced acute onset of slurred speech and left hemiplegia at 2:45 PM today. No previous history of stroke nor TIA. Patient has been taking aspirin daily. CT scan of his head showed appeared to be increased density involving the right proximal middle cerebral artery. 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 NIH stroke score was 19. Patient was deemed a candidate for intravenous TPA which was administered. Family consented for revascularization procedures in IR.  LSN: 2:30 PM on 09/22/2013 tPA Given: Yes MRankin: 4  Past Medical History  Diagnosis Date  . Diabetes mellitus without complication     History reviewed. No pertinent past surgical history.  No family history on file. Social History:  has no tobacco, alcohol, and drug history on file.  Allergies: Allergies not on file   (Not in a hospital admission)  ROS: History obtained from child  General ROS: negative for - chills, fatigue, fever, night sweats, weight gain or weight loss Psychological ROS: negative for - behavioral disorder, hallucinations, memory difficulties, mood swings or suicidal ideation Ophthalmic ROS: negative for - blurry vision, double vision, eye pain or loss of vision ENT ROS: negative for - epistaxis, nasal discharge, oral lesions, sore throat, tinnitus or vertigo Allergy and Immunology ROS: negative for - hives or itchy/watery eyes Hematological and Lymphatic ROS: negative for - bleeding problems, bruising or swollen lymph nodes Endocrine ROS: negative for - galactorrhea, hair pattern changes, polydipsia/polyuria or temperature intolerance Respiratory ROS: negative for - cough, hemoptysis, shortness of breath or wheezing Cardiovascular ROS: negative for - chest pain, dyspnea on exertion,  edema or irregular heartbeat Gastrointestinal ROS: negative for - abdominal pain, diarrhea, hematemesis, nausea/vomiting or stool incontinence Genito-Urinary ROS: negative for - dysuria, hematuria, incontinence or urinary frequency/urgency Musculoskeletal ROS: negative for - joint swelling or muscular weakness Neurological ROS: as noted in HPI Dermatological ROS: negative for rash and skin lesion changes  Physical Examination: Blood pressure 160/58, pulse 102, temperature 96.8 F (36 C), temperature source Rectal, resp. rate 16, height _0  (1.753 m), weight 65.499 kg (144 lb 6.4 oz), SpO2 98.00%.  HEENT-  Normocephalic, no lesions, without obvious abnormality.  Normal external eye and conjunctiva.  Normal TM's bilaterally.  Normal auditory canals and external ears. Normal external nose, mucus membranes and septum.  Normal pharynx. Neck supple with no masses, nodes, nodules or enlargement. Cardiovascular - regular rate and rhythm, S1, S2 normal, no murmur, click, rub or gallop Lungs - mild expiratory wheezing heard diffusely throughout both lungs, Heart exam - S1, S2 normal, no murmur, no gallop, rate regular Abdomen - soft, non-tender; bowel sounds normal; no masses,  no organomegaly Extremities - no joint deformities, effusion, or inflammation and no edema  Neurologic Examination: Mental Status: Lethargic, disoriented to age and present month.  Speech markedly dysarthric. Able to follow commands with use of right extremities. Patient had marked neglect of entire left side. Cranial Nerves: II-dense left homonymous hemianopsia. III/IV/VI-Pupils were equal and reacted. Extraocular movements were full and conjugate.    V/VII-reduced perception of tactile sensation of the left side of face; no facial weakness. Threasa Beards dysarthric speech. XII-midline tongue extension Motor: Flaccid paralysis of left extremities; normal strength and muscle tone of right extremities. Sensory:  Complete neglect of left side. Deep Tendon Reflexes: 1+ and symmetric. Plantars: Mute bilaterally Cerebellar:  Normal finger-to-nose testing on the right.  Results for orders placed during the hospital encounter of 09/22/13 (from the past 48 hour(s))  ETHANOL     Status: None   Collection Time    09/22/13  4:00 PM      Result Value Range   Alcohol, Ethyl (B) <11  0 - 11 mg/dL   Comment:            LOWEST DETECTABLE LIMIT FOR     SERUM ALCOHOL IS 11 mg/dL     FOR MEDICAL PURPOSES ONLY  PROTIME-INR     Status: None   Collection Time    09/22/13  4:00 PM      Result Value Range   Prothrombin Time 12.8  11.6 - 15.2 seconds   INR 0.98  0.00 - 1.49  APTT     Status: None   Collection Time    09/22/13  4:00 PM      Result Value Range   aPTT 29  24 - 37 seconds  CBC     Status: None   Collection Time    09/22/13  4:00 PM      Result Value Range   WBC 9.7  4.0 - 10.5 K/uL   RBC 5.31  4.22 - 5.81 MIL/uL   Hemoglobin 16.6  13.0 - 17.0 g/dL   HCT 46.8  39.0 - 52.0 %   MCV 88.1  78.0 - 100.0 fL   MCH 31.3  26.0 - 34.0 pg   MCHC 35.5  30.0 - 36.0 g/dL   RDW 15.0  11.5 - 15.5 %   Platelets 259  150 - 400 K/uL  DIFFERENTIAL     Status: Abnormal   Collection Time    09/22/13  4:00 PM      Result Value Range   Neutrophils Relative % 60  43 - 77 %   Neutro Abs 5.8  1.7 - 7.7 K/uL   Lymphocytes Relative 28  12 - 46 %   Lymphs Abs 2.7  0.7 - 4.0 K/uL   Monocytes Relative 11  3 - 12 %   Monocytes Absolute 1.1 (*) 0.1 - 1.0 K/uL   Eosinophils Relative 1  0 - 5 %   Eosinophils Absolute 0.1  0.0 - 0.7 K/uL   Basophils Relative 1  0 - 1 %   Basophils Absolute 0.1  0.0 - 0.1 K/uL  COMPREHENSIVE METABOLIC PANEL     Status: Abnormal   Collection Time    09/22/13  4:00 PM      Result Value Range   Sodium 133 (*) 137 - 147 mEq/L   Potassium 4.4  3.7 - 5.3 mEq/L   Chloride 95 (*) 96 - 112 mEq/L   CO2 28  19 - 32 mEq/L   Glucose, Bld 111 (*) 70 - 99 mg/dL   BUN 11  6 - 23 mg/dL    Creatinine, Ser 1.03  0.50 - 1.35 mg/dL   Calcium 9.5  8.4 - 10.5 mg/dL   Total Protein 7.1  6.0 - 8.3 g/dL   Albumin 3.8  3.5 - 5.2 g/dL   AST 18  0 - 37 U/L   Comment: HEMOLYSIS AT THIS LEVEL MAY AFFECT RESULT   ALT 9  0 - 53 U/L   Alkaline Phosphatase 82  39 - 117 U/L   Total Bilirubin 0.4  0.3 - 1.2 mg/dL   GFR calc non Af Amer 70 (*) >90 mL/min   GFR calc Af Wyvonnia Lora  81 (*) >90 mL/min   Comment: (NOTE)     The eGFR has been calculated using the CKD EPI equation.     This calculation has not been validated in all clinical situations.     eGFR's persistently <90 mL/min signify possible Chronic Kidney     Disease.  TROPONIN I     Status: None   Collection Time    09/22/13  4:00 PM      Result Value Range   Troponin I <0.30  <0.30 ng/mL   Comment:            Due to the release kinetics of cTnI,     a negative result within the first hours     of the onset of symptoms does not rule out     myocardial infarction with certainty.     If myocardial infarction is still suspected,     repeat the test at appropriate intervals.  POCT I-STAT TROPONIN I     Status: None   Collection Time    09/22/13  4:09 PM      Result Value Range   Troponin i, poc 0.01  0.00 - 0.08 ng/mL   Comment 3            Comment: Due to the release kinetics of cTnI,     a negative result within the first hours     of the onset of symptoms does not rule out     myocardial infarction with certainty.     If myocardial infarction is still suspected,     repeat the test at appropriate intervals.  POCT I-STAT, CHEM 8     Status: Abnormal   Collection Time    09/22/13  4:11 PM      Result Value Range   Sodium 135 (*) 137 - 147 mEq/L   Potassium 4.3  3.7 - 5.3 mEq/L   Chloride 98  96 - 112 mEq/L   BUN 11  6 - 23 mg/dL   Creatinine, Ser 1.20  0.50 - 1.35 mg/dL   Glucose, Bld 112 (*) 70 - 99 mg/dL   Calcium, Ion 1.20  1.13 - 1.30 mmol/L   TCO2 28  0 - 100 mmol/L   Hemoglobin 17.7 (*) 13.0 - 17.0 g/dL   HCT 52.0   39.0 - 52.0 %  GLUCOSE, CAPILLARY     Status: Abnormal   Collection Time    09/22/13  4:12 PM      Result Value Range   Glucose-Capillary 110 (*) 70 - 99 mg/dL  URINE RAPID DRUG SCREEN (HOSP PERFORMED)     Status: None   Collection Time    09/22/13  4:32 PM      Result Value Range   Opiates NONE DETECTED  NONE DETECTED   Cocaine NONE DETECTED  NONE DETECTED   Benzodiazepines NONE DETECTED  NONE DETECTED   Amphetamines NONE DETECTED  NONE DETECTED   Tetrahydrocannabinol NONE DETECTED  NONE DETECTED   Barbiturates NONE DETECTED  NONE DETECTED   Comment:            DRUG SCREEN FOR MEDICAL PURPOSES     ONLY.  IF CONFIRMATION IS NEEDED     FOR ANY PURPOSE, NOTIFY LAB     WITHIN 5 DAYS.                LOWEST DETECTABLE LIMITS     FOR URINE DRUG SCREEN     Drug Class  Cutoff (ng/mL)     Amphetamine      1000     Barbiturate      200     Benzodiazepine   546     Tricyclics       503     Opiates          300     Cocaine          300     THC              50  URINALYSIS, ROUTINE W REFLEX MICROSCOPIC     Status: Abnormal   Collection Time    09/22/13  4:32 PM      Result Value Range   Color, Urine YELLOW  YELLOW   APPearance CLOUDY (*) CLEAR   Specific Gravity, Urine 1.015  1.005 - 1.030   pH 6.5  5.0 - 8.0   Glucose, UA NEGATIVE  NEGATIVE mg/dL   Hgb urine dipstick NEGATIVE  NEGATIVE   Bilirubin Urine NEGATIVE  NEGATIVE   Ketones, ur NEGATIVE  NEGATIVE mg/dL   Protein, ur 100 (*) NEGATIVE mg/dL   Urobilinogen, UA 1.0  0.0 - 1.0 mg/dL   Nitrite NEGATIVE  NEGATIVE   Leukocytes, UA NEGATIVE  NEGATIVE  URINE MICROSCOPIC-ADD ON     Status: None   Collection Time    09/22/13  4:32 PM      Result Value Range   Squamous Epithelial / LPF RARE  RARE   WBC, UA 0-2  <3 WBC/hpf   RBC / HPF 0-2  <3 RBC/hpf   Bacteria, UA RARE  RARE   Ct Head Wo Contrast  09/22/2013   CLINICAL DATA:  Left-sided weakness.  EXAM: CT HEAD WITHOUT CONTRAST  TECHNIQUE: Contiguous axial images were  obtained from the base of the skull through the vertex without intravenous contrast.  COMPARISON:  None.  FINDINGS: Slightly motion degraded exam.  No intracranial hemorrhage.  Dense appearance of the M1 segment of the right middle cerebral artery. This may indicate thrombus or be normal.  Small vessel disease type changes.  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  No intracranial mass lesion noted on this unenhanced exam.  IMPRESSION: Slightly motion degraded exam.  No intracranial hemorrhage.  Dense appearance of the M1 segment of the right middle cerebral artery. This may indicate thrombus or be normal (only minimally different than the left side).  Small vessel disease type changes.  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  These results were called by telephone at the time of interpretation on 09/22/2013 at 4:10 PM to Dr. Carolann Littler who verbally acknowledged these results.   Electronically Signed   By: Chauncey Cruel M.D.   On: 09/22/2013 16:25    Assessment: 73 y.o. 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  Stroke Risk Factors - diabetes mellitus and hypertension  Plan: 1. Post TPA/IR management protocol and neuro ICU 2. MRI of the brain without contrast 3. PT consult, OT consult, Speech consult 4. Echocardiogram 5. HgbA1c, fasting lipid panel 6. Prophylactic therapy-encephalitis 7. Risk factor modification 8. Telemetry monitoring  Patient's workup required complex management decision-making, including use of intravenous thrombolytic therapy with TPA, consultation with interventional radiologist, and family counseling. Total critical care time was 120 minutes.   C.R. Nicole Kindred, MD [facility] [phone]  09/22/2013, 5:47 PM

## 2013-09-22 NOTE — ED Notes (Addendum)
Transported to IR by IR and Stroke team with patient belongings bags includes clothes and watch.

## 2013-09-22 NOTE — ED Provider Notes (Signed)
CSN: [id number]     Arrival date & time 09/22/13  1548 History   First MD Initiated Contact with Patient 09/22/13 1606     Chief Complaint  Patient presents with  . Code Stroke    HPI  Patient presents via EMS. Report from EMS is that he was last seen normal at 245 this afternoon. His behavior changed. Wife gave him a cook he because he is diabetic. Symptoms did not change. He was noted to be not moving his left side.  He was transferred here as a "code stroke" patient.  I was the care of another physician to evaluate him upon his arrival in the ED.  Past Medical History  Diagnosis Date  . Diabetes mellitus without complication    History reviewed. No pertinent past surgical history. No family history on file. History  Substance Use Topics  . Smoking status: Not on file  . Smokeless tobacco: Not on file  . Alcohol Use: Not on file    Review of Systems  Unable to perform ROS: Mental status change   patient has difficulty use dysarthric he does not seem aphasic, and he is able to answer yes and no and shake or nod yes or no.  Allergies  Review of patient's allergies indicates not on file.  Home Medications  No current outpatient prescriptions on file. BP 158/91  Pulse 94  Temp(Src) 96.8 F (36 C) (Rectal)  Resp 20  Ht 5\' 9"  (1.753 m)  Wt 144 lb 6.4 oz (65.499 kg)  BMI 21.31 kg/m2  SpO2 98% Physical Exam  Constitutional:  His eyes are open.  He has a leftward gaze.  HENT:  Left lower facial droop  Eyes:  Review millimeters, symmetric, and reactive.  Eyes deviated leftward preferred right gaze.  Neck:  No JVD.  No bruits.  Cardiovascular:  Sinus rhythm.  Regular.  Pulmonary/Chest:  Mild prolongation and wheezing.  No focal diminished breath sounds.  Abdominal:  Well-healed cholecystectomy, and midline scar.  Normoactive bowel sounds.  Musculoskeletal:  No muscle wasting.  Neurological:  NIH 19.  Left upper and lower extremity hemiparesis.  Left facial droop.   Has a gag reflex and protect his airway.      ED Course  Procedures (including critical care time) Labs Review Labs Reviewed  DIFFERENTIAL - Abnormal; Notable for the following:    Monocytes Absolute 1.1 (*)    All other components within normal limits  GLUCOSE, CAPILLARY - Abnormal; Notable for the following:    Glucose-Capillary 110 (*)    All other components within normal limits  POCT I-STAT, CHEM 8 - Abnormal; Notable for the following:    Sodium 135 (*)    Glucose, Bld 112 (*)    Hemoglobin 17.7 (*)    All other components within normal limits  PROTIME-INR  APTT  CBC  ETHANOL  COMPREHENSIVE METABOLIC PANEL  TROPONIN I  URINE RAPID DRUG SCREEN (HOSP PERFORMED)  URINALYSIS, ROUTINE W REFLEX MICROSCOPIC  POCT I-STAT TROPONIN I   Imaging Review Ct Head Wo Contrast  09/22/2013   CLINICAL DATA:  Left-sided weakness.  EXAM: CT HEAD WITHOUT CONTRAST  TECHNIQUE: Contiguous axial images were obtained from the base of the skull through the vertex without intravenous contrast.  COMPARISON:  None.  FINDINGS: Slightly motion degraded exam.  No intracranial hemorrhage.  Dense appearance of the M1 segment of the right middle cerebral artery. This may indicate thrombus or be normal.  Small vessel disease type changes.  Global atrophy without  hydrocephalus.  No intracranial mass lesion noted on this unenhanced exam.  IMPRESSION: Slightly motion degraded exam.  No intracranial hemorrhage.  Dense appearance of the M1 segment of the right middle cerebral artery. This may indicate thrombus or be normal (only minimally different than the left side).  Small vessel disease type changes.  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  These results were called by telephone at the time of interpretation on 09/22/2013 at 4:10 PM to Dr. Joelene Millin who verbally acknowledged these results.   Electronically Signed   By: Bridgett Larsson M.D.   On: 09/22/2013 16:25    EKG Interpretation   None       MDM   1. CVA  (cerebral infarction)    Patient seen by myself, Dr. Roseanne Reno neurology.  Symptoms are within the 3 hour window.  Symptoms and findings consistent with right middle cerebral stroke with left hemiparesis.  I received a  call from radiologist describing what appeared to be a right MCA lesion.  I discussed the CT with Dr. Roseanne Reno.  He is seen the CT as well.  Plan is  intravenous TPA.  ICU bed.  Possible directed therapy as needed.    Rolland Porter, MD 09/22/13 [phone]

## 2013-09-23 ENCOUNTER — Inpatient Hospital Stay (HOSPITAL_COMMUNITY): Payer: Medicare Other

## 2013-09-23 ENCOUNTER — Encounter (HOSPITAL_COMMUNITY): Payer: Self-pay

## 2013-09-23 LAB — BLOOD GAS, ARTERIAL
ACID-BASE DEFICIT: 5.7 mmol/L — AB (ref 0.0–2.0)
Bicarbonate: 19 mEq/L — ABNORMAL LOW (ref 20.0–24.0)
Drawn by: 36274
FIO2: 40 %
MECHVT: 560 mL
O2 SAT: 98.9 %
PCO2 ART: 35.9 mmHg (ref 35.0–45.0)
PEEP: 5 cmH2O
Patient temperature: 98.6
RATE: 14 resp/min
TCO2: 20.1 mmol/L (ref 0–100)
pH, Arterial: 7.343 — ABNORMAL LOW (ref 7.350–7.450)
pO2, Arterial: 146 mmHg — ABNORMAL HIGH (ref 80.0–100.0)

## 2013-09-23 LAB — CBC WITH DIFFERENTIAL/PLATELET
Basophils Absolute: 0 10*3/uL (ref 0.0–0.1)
Basophils Relative: 0 % (ref 0–1)
EOS ABS: 0 10*3/uL (ref 0.0–0.7)
Eosinophils Relative: 0 % (ref 0–5)
HCT: 40.5 % (ref 39.0–52.0)
Hemoglobin: 14.2 g/dL (ref 13.0–17.0)
LYMPHS PCT: 7 % — AB (ref 12–46)
Lymphs Abs: 0.9 10*3/uL (ref 0.7–4.0)
MCH: 30.9 pg (ref 26.0–34.0)
MCHC: 35.1 g/dL (ref 30.0–36.0)
MCV: 88.2 fL (ref 78.0–100.0)
Monocytes Absolute: 0.7 10*3/uL (ref 0.1–1.0)
Monocytes Relative: 5 % (ref 3–12)
Neutro Abs: 12.5 10*3/uL — ABNORMAL HIGH (ref 1.7–7.7)
Neutrophils Relative %: 89 % — ABNORMAL HIGH (ref 43–77)
PLATELETS: 238 10*3/uL (ref 150–400)
RBC: 4.59 MIL/uL (ref 4.22–5.81)
RDW: 15.1 % (ref 11.5–15.5)
WBC: 14.1 10*3/uL — AB (ref 4.0–10.5)

## 2013-09-23 LAB — LIPID PANEL
CHOL/HDL RATIO: 4 ratio
Cholesterol: 158 mg/dL (ref 0–200)
HDL: 40 mg/dL (ref >39)
LDL Cholesterol: 104 mg/dL — ABNORMAL HIGH (ref 0–99)
TRIGLYCERIDES: 70 mg/dL (ref <150)
VLDL: 14 mg/dL (ref 0–40)

## 2013-09-23 LAB — PHOSPHORUS: PHOSPHORUS: 3.8 mg/dL (ref 2.3–4.6)

## 2013-09-23 LAB — GLUCOSE, CAPILLARY
GLUCOSE-CAPILLARY: 175 mg/dL — AB (ref 70–99)
GLUCOSE-CAPILLARY: 119 mg/dL — AB (ref 70–99)
GLUCOSE-CAPILLARY: 129 mg/dL — AB (ref 70–99)
Glucose-Capillary: 140 mg/dL — ABNORMAL HIGH (ref 70–99)
Glucose-Capillary: 146 mg/dL — ABNORMAL HIGH (ref 70–99)

## 2013-09-23 LAB — HEMOGLOBIN A1C
Hgb A1c MFr Bld: 5.9 % — ABNORMAL HIGH (ref <5.7)
MEAN PLASMA GLUCOSE: 123 mg/dL — AB (ref <117)

## 2013-09-23 LAB — BASIC METABOLIC PANEL
BUN: 16 mg/dL (ref 6–23)
CALCIUM: 8.9 mg/dL (ref 8.4–10.5)
CO2: 18 meq/L — AB (ref 19–32)
Chloride: 101 mEq/L (ref 96–112)
Creatinine, Ser: 1.13 mg/dL (ref 0.50–1.35)
GFR calc Af Amer: 73 mL/min — ABNORMAL LOW (ref >90)
GFR calc non Af Amer: 63 mL/min — ABNORMAL LOW (ref >90)
Glucose, Bld: 165 mg/dL — ABNORMAL HIGH (ref 70–99)
Potassium: 4.9 mEq/L (ref 3.7–5.3)
SODIUM: 134 meq/L — AB (ref 137–147)

## 2013-09-23 LAB — TSH: TSH: 1.049 u[IU]/mL (ref 0.350–4.500)

## 2013-09-23 LAB — SODIUM: Sodium: 137 mEq/L (ref 137–147)

## 2013-09-23 LAB — MAGNESIUM: Magnesium: 1.8 mg/dL (ref 1.5–2.5)

## 2013-09-23 MED ORDER — INSULIN ASPART 100 UNIT/ML ~~LOC~~ SOLN
0.0000 [IU] | SUBCUTANEOUS | Status: DC
  Administered 2013-09-23 – 2013-09-24 (×5): 1 [IU] via SUBCUTANEOUS

## 2013-09-23 MED ORDER — SODIUM CHLORIDE 3 % IV SOLN
INTRAVENOUS | Status: DC
  Administered 2013-09-23: 20:00:00 via INTRAVENOUS
  Filled 2013-09-23 (×6): qty 500

## 2013-09-23 MED ORDER — ALBUTEROL SULFATE (5 MG/ML) 0.5% IN NEBU
2.5000 mg | INHALATION_SOLUTION | Freq: Four times a day (QID) | RESPIRATORY_TRACT | Status: DC
  Filled 2013-09-23: qty 0.5

## 2013-09-23 MED ORDER — ALBUTEROL SULFATE (2.5 MG/3ML) 0.083% IN NEBU
2.5000 mg | INHALATION_SOLUTION | Freq: Four times a day (QID) | RESPIRATORY_TRACT | Status: DC
  Administered 2013-09-23 – 2013-09-24 (×4): 2.5 mg via RESPIRATORY_TRACT
  Filled 2013-09-23 (×3): qty 3

## 2013-09-23 MED ORDER — ASPIRIN 300 MG RE SUPP
300.0000 mg | Freq: Every day | RECTAL | Status: DC
  Administered 2013-09-23 – 2013-09-24 (×2): 300 mg via RECTAL
  Filled 2013-09-23 (×2): qty 1

## 2013-09-23 MED ORDER — SODIUM CHLORIDE 0.9 % IV BOLUS (SEPSIS)
500.0000 mL | Freq: Once | INTRAVENOUS | Status: AC
  Administered 2013-09-23: 500 mL via INTRAVENOUS

## 2013-09-23 MED ORDER — SODIUM CHLORIDE 0.9 % IV BOLUS (SEPSIS)
750.0000 mL | Freq: Once | INTRAVENOUS | Status: AC
  Administered 2013-09-23: 750 mL via INTRAVENOUS

## 2013-09-23 MED ORDER — MIDAZOLAM HCL 2 MG/2ML IJ SOLN
INTRAMUSCULAR | Status: AC
  Administered 2013-09-23: 2 mg
  Filled 2013-09-23: qty 2

## 2013-09-23 MED ORDER — MIDAZOLAM HCL 2 MG/2ML IJ SOLN: 2.0000 mg | Freq: Once | INTRAMUSCULAR | Status: DC

## 2013-09-23 NOTE — Progress Notes (Signed)
Subjective: Pt with CVA, s/p Rt MCA revasc with IA TPA and clot retrieval. Pt remians intubated.  Objective: Physical Exam: BP 126/76  Pulse 100  Temp(Src) 98.3 F (36.8 C) (Axillary)  Resp 20  Ht 5\' 9"  (1.753 m)  Wt 144 lb 6.4 oz (65.499 kg)  BMI 21.31 kg/m2  SpO2 99% Intubated but nods to voice and moves right UE and LE to command.  No movement on left (B)groins soft, no hematoma. Sheaths being removed at time of this note. Feet warm, good cap refill.  Labs: CBC  Recent Labs  09/22/13 1600 09/22/13 1611 09/23/13 0500  WBC 9.7  --  14.1*  HGB 16.6 17.7* 14.2  HCT 46.8 52.0 40.5  PLT 259  --  238   BMET  Recent Labs  09/22/13 1600 09/22/13 1611 09/23/13 0500  NA 133* 135* 134*  K 4.4 4.3 4.9  CL 95* 98 101  CO2 28  --  18*  GLUCOSE 111* 112* 165*  BUN 11 11 16   CREATININE 1.03 1.20 1.13  CALCIUM 9.5  --  8.9   LFT  Recent Labs  09/22/13 1600  PROT 7.1  ALBUMIN 3.8  AST 18  ALT 9  ALKPHOS 82  BILITOT 0.4   PT/INR  Recent Labs  09/22/13 1600  LABPROT 12.8  INR 0.98     Studies/Results: Ct Head Wo Contrast  09/22/2013   CLINICAL DATA:  Status post t-PA. Status post right MCA embolectomy. Stroke risk factors include diabetes and hypertension. Left-sided weakness.  EXAM: CT HEAD WITHOUT CONTRAST  TECHNIQUE: Contiguous axial images were obtained from the base of the skull through the vertex without contrast.  COMPARISON:  Preprocedure CT head at 1402 hr.  FINDINGS: The patient is status post cerebral angiography with revascularization. Vascular opacification proximally with apparent gross patency of both internal carotid arteries and both middle cerebral arteries on this non CTA examination.  Postcontrast enhancement and/or microhemorrhage affecting the right frontal and temporal opercular cortex, right insular cortex, and putamen, as well as the periventricular white matter on the right. It is unclear if this represents pooling of contrast  secondary to disruption of the blood-brain barrier or early microhemorrhage post t-PA, or a combination of both. Repeat CT scan 12 hr from without contrast should help differentiate.  No subarachnoid or intraventricular blood. No significant edema or midline shift. Normal-appearing left hemisphere and posterior fossa structures. Unchanged calvarium and extracranial soft tissues.  IMPRESSION: Postcontrast enhancement and/or microhemorrhage affecting the right hemisphere cortex, white matter, and basal ganglia structures subserved by the right MCA vascular territory. Gross patency of the right anterior circulation has been re-established.  No significant mass effect or midline shift. Continued surveillance is warranted.   Electronically Signed   By: Davonna BellingJohn  Curnes M.D.   On: 09/22/2013 20:09   Ct Head Wo Contrast  09/22/2013   CLINICAL DATA:  Left-sided weakness.  EXAM: CT HEAD WITHOUT CONTRAST  TECHNIQUE: Contiguous axial images were obtained from the base of the skull through the vertex without intravenous contrast.  COMPARISON:  None.  FINDINGS: Slightly motion degraded exam.  No intracranial hemorrhage.  Dense appearance of the M1 segment of the right middle cerebral artery. This may indicate thrombus or be normal.  Small vessel disease type changes.  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  No intracranial mass lesion noted on this unenhanced exam.  IMPRESSION: Slightly motion degraded exam.  No intracranial hemorrhage.  Dense appearance of the M1 segment of the right middle cerebral artery. This may  indicate thrombus or be normal (only minimally different than the left side).  Small vessel disease type changes.  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  These results were called by telephone at the time of interpretation on 09/22/2013 at 4:10 PM to Dr. Joelene Millin who verbally acknowledged these results.   Electronically Signed   By: Bridgett Larsson M.D.   On: 09/22/2013 16:25   Dg Chest Port 1 View  09/23/2013    CLINICAL DATA:  Intubation.  EXAM: PORTABLE CHEST - 1 VIEW  COMPARISON:  09/22/2013.  FINDINGS: Endotracheal tube in stable position. Lungs are clear. COPD cannot be excluded. Heart size normal. No pleural effusion or pneumothorax identified. Lower most portions of the lung bases are not imaged. Elevation left hemidiaphragm.  IMPRESSION: 1. Endotracheal tube in stable position. 2. No acute cardiopulmonary disease. COPD cannot be excluded. Stable elevation left hemidiaphragm.   Electronically Signed   By: Maisie Fus  Register   On: 09/23/2013 07:04   Dg Chest Port 1 View  09/23/2013   CLINICAL DATA:  Post transcatheter recanalization of occluded right middle cerebral artery. Intubation.  EXAM: PORTABLE CHEST - 1 VIEW  COMPARISON:  None.  FINDINGS: Endotracheal tube tip in satisfactory position projecting approximately 5 cm above the carina. Cardiac silhouette normal in size for the AP semi-erect technique. Hyperinflated right lung. Elevation of the left hemidiaphragm. Lungs clear. Bronchovascular markings normal. Pulmonary vascularity normal. No visible pleural effusions. No pneumothorax.  IMPRESSION: 1. Endotracheal tube tip in satisfactory position projecting approximately 5 cm above the carina. 2. Hyperinflation consistent with COPD and/or asthma. Elevation of the left hemidiaphragm. No acute cardiopulmonary disease.   Electronically Signed   By: Hulan Saas M.D.   On: 09/23/2013 02:19    Assessment/Plan: CVA S/p Rt MCA revasc with IA TPA and clot retrieval. Intubated. Follow up MRI today. Sheaths out.  Will report to [facility]     LOS: 1 day    Brayton El PA-C 09/23/2013 10:54 AM

## 2013-09-23 NOTE — Progress Notes (Signed)
OT Cancellation Note  Patient Details Name: Randy Taylor MRN: [id number] DOB: [birth year]   Cancelled Treatment:    Reason Eval/Treat Not Completed: Patient not medically ready Sheath pulled. Bed rest. Will assess in am if appropriate. [facility] LLCWARD,HILLARY Calel Pisarski, OTR/L  [phone] 09/23/2013 09/23/2013, 5:12 PM

## 2013-09-23 NOTE — Progress Notes (Signed)
PT Cancellation Note  Patient Details Name: Randy Taylor MRN: [id number] DOB: 07-Mar-1941   Cancelled Treatment:    Reason Eval/Treat Not Completed: Patient not medically ready. Pt remains to have bilat groin sheaths. PT to re-attempt when pt medically ready.   Marcene BrawnChadwell, Tabitha Tupper Marie 09/23/2013, 8:04 AM

## 2013-09-23 NOTE — Consult Note (Signed)
Name: Randy Taylor MRN: [id number] DOB: August 05, 1941    ADMISSION DATE:  09/22/2013 CONSULTATION DATE:  09/22/13  REFERRING MD :  Dr. Roseanne Reno PRIMARY SERVICE:  Neurology  CHIEF COMPLAINT:  Slurred speech, left-sided weakness  BRIEF PATIENT DESCRIPTION: The patient is a 73 yo man, history of HTN, DM, presenting 1/13 with slurred speech, left-sided weakness, and left-sided neglect, found to have R MCA occlusion by CT and angiogram, given TPA and taken to IR for TPA and clot retrieval.  SIGNIFICANT EVENTS / STUDIES:  1/13 ct head>>>m1 segment infarct 1/13 - admitted for acute R MCA stroke, s/p TPA and IR clot retrieval 1/14 ct head>>Postcontrast enhancement and/or microhemorrhage affecting the right hemisphere cortex, white matter, and basal ganglia structures subserved by the right MCA vascular territory  1/14 weaning  LINES / TUBES: ETT 1/13 >> Foley 1/13>>  CULTURES: None  ANTIBIOTICS: None  SUBJECTIVE: weaning  VITAL SIGNS: Temp:  [96.1 F (35.6 C)-98.4 F (36.9 C)] 98.4 F (36.9 C) (01/14 0400) Pulse Rate:  [48-110] 100 (01/14 0832) Resp:  [12-28] 20 (01/14 0832) BP: (87-166)/(58-139) 126/76 mmHg (01/14 0832) SpO2:  [98 %-100 %] 99 % (01/14 0832) Arterial Line BP: (77-151)/(53-79) 130/76 mmHg (01/14 0832) FiO2 (%):  [30 %-100 %] 30 % (01/14 0842) Weight:  [65.499 kg (144 lb 6.4 oz)-65.5 kg (144 lb 6.4 oz)] 65.499 kg (144 lb 6.4 oz) (01/13 1611) HEMODYNAMICS:   VENTILATOR SETTINGS: Vent Mode:  [-] PSV;CPAP FiO2 (%):  [30 %-100 %] 30 % Set Rate:  [14 bmp] 14 bmp Vt Set:  [550 mL-560 mL] 560 mL PEEP:  [5 cmH20] 5 cmH20 Pressure Support:  [5 cmH20] 5 cmH20 Plateau Pressure:  [12 cmH20-15 cmH20] 14 cmH20 INTAKE / OUTPUT: Intake/Output     01/13 0701 - 01/14 0700 01/14 0701 - 01/15 0700   I.V. (mL/kg) 1185 (18.1)    Total Intake(mL/kg) 1185 (18.1)    Urine (mL/kg/hr) 1065 30 (0.2)   Total Output 1065 30   Net +120 -30          PHYSICAL  EXAMINATION: General: Intubated, sedated HEENT: pupils, rass -1 Neck: supple, no bruits ascultated Lungs: CTA Heart: distant heart sounds, irregularly irregular rhythm, regular rate, no murmurs Abdomen: soft, non-tender, non-distended, hypoactive bowel sounds Extremities: no cyanosis, clubbing, or edema Neurologic:follows commands, no movement left   LABS:  CBC  Recent Labs Lab 09/22/13 1600 09/22/13 1611 09/23/13 0500  WBC 9.7  --  14.1*  HGB 16.6 17.7* 14.2  HCT 46.8 52.0 40.5  PLT 259  --  238   Coag's  Recent Labs Lab 09/22/13 1600  APTT 29  INR 0.98   BMET  Recent Labs Lab 09/22/13 1600 09/22/13 1611 09/23/13 0500  NA 133* 135* 134*  K 4.4 4.3 4.9  CL 95* 98 101  CO2 28  --  18*  BUN 11 11 16   CREATININE 1.03 1.20 1.13  GLUCOSE 111* 112* 165*   Electrolytes  Recent Labs Lab 09/22/13 1600 09/23/13 0500  CALCIUM 9.5 8.9  MG  --  1.8  PHOS  --  3.8   Sepsis Markers No results found for this basename: LATICACIDVEN, PROCALCITON, O2SATVEN,  in the last 168 hours ABG  Recent Labs Lab 09/22/13 2130 09/23/13 0500  PHART 7.376 7.343*  PCO2ART 35.7 35.9  PO2ART 87.6 146.0*   Liver Enzymes  Recent Labs Lab 09/22/13 1600  AST 18  ALT 9  ALKPHOS 82  BILITOT 0.4  ALBUMIN 3.8   Cardiac Enzymes  Recent Labs Lab 09/22/13 1600  TROPONINI <0.30   Glucose  Recent Labs Lab 09/22/13 1612 09/22/13 2039 09/23/13 0049 09/23/13 0500  GLUCAP 110* 136* 146* 175*    Imaging Ct Head Wo Contrast  09/22/2013   CLINICAL DATA:  Status post t-PA. Status post right MCA embolectomy. Stroke risk factors include diabetes and hypertension. Left-sided weakness.  EXAM: CT HEAD WITHOUT CONTRAST  TECHNIQUE: Contiguous axial images were obtained from the base of the skull through the vertex without contrast.  COMPARISON:  Preprocedure CT head at 1402 hr.  FINDINGS: The patient is status post cerebral angiography with revascularization. Vascular  opacification proximally with apparent gross patency of both internal carotid arteries and both middle cerebral arteries on this non CTA examination.  Postcontrast enhancement and/or microhemorrhage affecting the right frontal and temporal opercular cortex, right insular cortex, and putamen, as well as the periventricular white matter on the right. It is unclear if this represents pooling of contrast secondary to disruption of the blood-brain barrier or early microhemorrhage post t-PA, or a combination of both. Repeat CT scan 12 hr from without contrast should help differentiate.  No subarachnoid or intraventricular blood. No significant edema or midline shift. Normal-appearing left hemisphere and posterior fossa structures. Unchanged calvarium and extracranial soft tissues.  IMPRESSION: Postcontrast enhancement and/or microhemorrhage affecting the right hemisphere cortex, white matter, and basal ganglia structures subserved by the right MCA vascular territory. Gross patency of the right anterior circulation has been re-established.  No significant mass effect or midline shift. Continued surveillance is warranted.   Electronically Signed   By: Davonna Belling M.D.   On: 09/22/2013 20:09   Ct Head Wo Contrast  09/22/2013   CLINICAL DATA:  Left-sided weakness.  EXAM: CT HEAD WITHOUT CONTRAST  TECHNIQUE: Contiguous axial images were obtained from the base of the skull through the vertex without intravenous contrast.  COMPARISON:  None.  FINDINGS: Slightly motion degraded exam.  No intracranial hemorrhage.  Dense appearance of the M1 segment of the right middle cerebral artery. This may indicate thrombus or be normal.  Small vessel disease type changes.  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  No intracranial mass lesion noted on this unenhanced exam.  IMPRESSION: Slightly motion degraded exam.  No intracranial hemorrhage.  Dense appearance of the M1 segment of the right middle cerebral artery. This may indicate thrombus  or be normal (only minimally different than the left side).  Small vessel disease type changes.  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  These results were called by telephone at the time of interpretation on 09/22/2013 at 4:10 PM to Dr. Joelene Millin who verbally acknowledged these results.   Electronically Signed   By: Bridgett Larsson M.D.   On: 09/22/2013 16:25   Dg Chest Port 1 View  09/23/2013   CLINICAL DATA:  Intubation.  EXAM: PORTABLE CHEST - 1 VIEW  COMPARISON:  09/22/2013.  FINDINGS: Endotracheal tube in stable position. Lungs are clear. COPD cannot be excluded. Heart size normal. No pleural effusion or pneumothorax identified. Lower most portions of the lung bases are not imaged. Elevation left hemidiaphragm.  IMPRESSION: 1. Endotracheal tube in stable position. 2. No acute cardiopulmonary disease. COPD cannot be excluded. Stable elevation left hemidiaphragm.   Electronically Signed   By: Maisie Fus  Register   On: 09/23/2013 07:04   Dg Chest Port 1 View  09/23/2013   CLINICAL DATA:  Post transcatheter recanalization of occluded right middle cerebral artery. Intubation.  EXAM: PORTABLE CHEST - 1 VIEW  COMPARISON:  None.  FINDINGS: Endotracheal tube tip in satisfactory position projecting approximately 5 cm above the carina. Cardiac silhouette normal in size for the AP semi-erect technique. Hyperinflated right lung. Elevation of the left hemidiaphragm. Lungs clear. Bronchovascular markings normal. Pulmonary vascularity normal. No visible pleural effusions. No pneumothorax.  IMPRESSION: 1. Endotracheal tube tip in satisfactory position projecting approximately 5 cm above the carina. 2. Hyperinflation consistent with COPD and/or asthma. Elevation of the left hemidiaphragm. No acute cardiopulmonary disease.   Electronically Signed   By: Hulan Saashomas  Lawrence M.D.   On: 09/23/2013 02:19    CXR: improved tax left, ett wnl, no infiltrate  ASSESSMENT / PLAN:  PULMONARY  A: S/p Revasc of Rt MCA- Intubated,  Respiratory failure possibly due to sedation / CVA No evidence asp P:  - upright Wean this am cpap5 ps 5, goal 30min, assess rsbi, abg -pcxr in am   CARDIOVASCULAR  A: HTN  Atria Fibrillation- currently Rate controlled, ?new diagnosis, acute cva P:  -with current MAP in setting of cva, would avoid low MAP, hence, would consider dig or amio for rate control that wold not drop BP -ensure tsh - Carotid dopplers.  - F/u on Echo.  - F/u on Lipid Panel   RENAL  A: Hyponatremia mild, small NON AG  P:  - avoid free water -tolerate mild non AG , if worsen  = bicarb as would continue saline -reduce saline with NON AG  -chem in am   GASTROINTESTINAL  A: Nutrition SUP P:  - Pantoprazole- 40mg  daily.  - Zofran- 4mg  Q6H.  - Consider tube feeds, if not extubated  HEMATOLOGIC  A: small amount micorhem ct, fib P:  - SCDs, for now, no Anticoag.  -will discuss with Neuro timing  / role anticoagulation in future with new fib -Echo for clot  INFECTIOUS  A: None.  P:  - Monitor fever curve and WBC. -pcxr in am   ENDOCRINE  A: Diabetes Mellitus  P:  - CBGs- Q4H, can add SSI if elevated. - HBA1c.  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 new afib as cause of CVA? P:  - Fentanyl to rass -1 -dc versed and avoid benzo - Neuro Checks Q2h.  - Neurology following. -for MRI   TODAY'S SUMMARY: weaning, sheeth to dc, then consider extubation then MRI brain  I have personally obtained a history, examined the patient, evaluated laboratory and imaging results, formulated the assessment and plan and placed orders.  CRITICAL CARE: The patient is critically ill with multiple organ systems failure and requires high complexity decision making for assessment and support, frequent evaluation and titration of therapies, application of advanced monitoring technologies and extensive interpretation of multiple databases. Critical Care Time devoted to patient care services  described in this note is 30 minutes.   Mcarthur Rossettianiel J. Tyson AliasFeinstein, MD, FACP Pgr: [phone] [facility]

## 2013-09-23 NOTE — Procedures (Signed)
Central Venous Catheter Insertion Procedure Note Tora PerchesWillard S Splitt [id number] 01/18/1941  Procedure: Insertion of Central Venous Catheter Indications: Assessment of intravascular volume and Drug and/or fluid administration  Procedure Details Consent: Risks of procedure as well as the alternatives and risks of each were explained to the (patient/caregiver).  Consent for procedure obtained. Time Out: Verified patient identification, verified procedure, site/side was marked, verified correct patient position, special equipment/implants available, medications/allergies/relevent history reviewed, required imaging and test results available.  Performed  Maximum sterile technique was used including antiseptics, cap, gloves, gown, hand hygiene, mask and sheet. Skin prep: Chlorhexidine; local anesthetic administered A antimicrobial bonded/coated triple lumen catheter was placed in the right internal jugular vein using the Seldinger technique.  Evaluation Blood flow good Complications: No apparent complications Patient did tolerate procedure well. Chest X-ray ordered to verify placement.  CXR: pending.  Ovie Cornelio 09/23/2013, 7:07 PM  Real time 2D ultrasound used for vein site selection, patency assessment, and needle  A record of image was made but could not be submitted for filing due to malfunction of printing device

## 2013-09-23 NOTE — Progress Notes (Signed)
Pt. to CT scan-uneventful.

## 2013-09-23 NOTE — Progress Notes (Signed)
Stroke Team Progress Note  HISTORY Randy Taylor is an 73 y.o. male with a history of hypertension and diabetes mellitus who experienced acute onset of slurred speech and left hemiplegia at 2:45 PM today 1/113/2015. No previous history of stroke nor TIA. Patient has been taking aspirin daily. CT scan of his head appeared to be increased density involving the right proximal middle cerebral artery. 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 NIH stroke score was 19. Patient was deemed a candidate for intravenous TPA which was administered. Family consented for revascularization procedures in IR. Dr. Corliss Skainseveshwar obtained complete TICI3 revascularization of occluded RT MCA using 13.4 mg of IA TPA and x1 pass with the SOLITAIRE FR 4mm x 40 mm Retrieval device.  He was admitted to the neuro ICU for further evaluation and treatment.  SUBJECTIVE His RN is at the bedside, no family.     OBJECTIVE Most recent Vital Signs: Filed Vitals:   09/23/13 0600 09/23/13 0700 09/23/13 0800 09/23/13 0832  BP: 111/86 102/63 104/61 126/76  Pulse: 72 87 82 100  Temp:      TempSrc:      Resp: 15 15 14 20   Height:      Weight:      SpO2: 100% 100% 100% 99%   CBG (last 3)   Recent Labs  09/22/13 2039 09/23/13 0049 09/23/13 0500  GLUCAP 136* 146* 175*    IV Fluid Intake:   . sodium chloride 75 mL/hr at 09/23/13 0600  . niCARDipine      MEDICATIONS  . antiseptic oral rinse  15 mL Mouth Rinse q12n4p  . chlorhexidine  15 mL Mouth Rinse BID  . pantoprazole (PROTONIX) IV  40 mg Intravenous QHS   PRN:  acetaminophen, acetaminophen, fentaNYL, labetalol, midazolam, ondansetron (ZOFRAN) IV  Diet:  NPO  Activity:  Bedrest DVT Prophylaxis:  SCDs   CLINICALLY SIGNIFICANT STUDIES Basic Metabolic Panel:   Recent Labs Lab 09/22/13 1600 09/22/13 1611 09/23/13 0500  NA 133* 135* 134*  K 4.4 4.3 4.9  CL 95* 98 101  CO2 28  --  18*  GLUCOSE 111* 112* 165*  BUN 11 11 16    CREATININE 1.03 1.20 1.13  CALCIUM 9.5  --  8.9  MG  --   --  1.8  PHOS  --   --  3.8   Liver Function Tests:   Recent Labs Lab 09/22/13 1600  AST 18  ALT 9  ALKPHOS 82  BILITOT 0.4  PROT 7.1  ALBUMIN 3.8   CBC:   Recent Labs Lab 09/22/13 1600 09/22/13 1611 09/23/13 0500  WBC 9.7  --  14.1*  NEUTROABS 5.8  --  12.5*  HGB 16.6 17.7* 14.2  HCT 46.8 52.0 40.5  MCV 88.1  --  88.2  PLT 259  --  238   Coagulation:   Recent Labs Lab 09/22/13 1600  LABPROT 12.8  INR 0.98   Cardiac Enzymes:   Recent Labs Lab 09/22/13 1600  TROPONINI <0.30   Urinalysis:   Recent Labs Lab 09/22/13 1632  COLORURINE YELLOW  LABSPEC 1.015  PHURINE 6.5  GLUCOSEU NEGATIVE  HGBUR NEGATIVE  BILIRUBINUR NEGATIVE  KETONESUR NEGATIVE  PROTEINUR 100*  UROBILINOGEN 1.0  NITRITE NEGATIVE  LEUKOCYTESUR NEGATIVE   Lipid Panel    Component Value Date/Time   CHOL 158 09/23/2013 0500   TRIG 70 09/23/2013 0500   HDL 40 09/23/2013 0500   CHOLHDL 4.0 09/23/2013 0500   VLDL 14 09/23/2013 0500  LDLCALC 104* 09/23/2013 0500   HgbA1C  No results found for this basename: HGBA1C    Urine Drug Screen:     Component Value Date/Time   LABOPIA NONE DETECTED 09/22/2013 1632   COCAINSCRNUR NONE DETECTED 09/22/2013 1632   LABBENZ NONE DETECTED 09/22/2013 1632   AMPHETMU NONE DETECTED 09/22/2013 1632   THCU NONE DETECTED 09/22/2013 1632   LABBARB NONE DETECTED 09/22/2013 1632    Alcohol Level:   Recent Labs Lab 09/22/13 1600  ETH <11    CT of the brain   09/22/2013   Postcontrast enhancement and/or microhemorrhage affecting the right hemisphere cortex, white matter, and basal ganglia structures subserved by the right MCA vascular territory. Gross patency of the right anterior circulation has been re-established.  No significant mass effect or midline shift. Continued surveillance is warranted.  09/22/2013    Slightly motion degraded exam.  No intracranial hemorrhage.  Dense appearance of the M1  segment of the right middle cerebral artery. This may indicate thrombus or be normal (only minimally different than the left side).  Small vessel disease type changes.  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    Cerebral Angio 09/22/2013 complete TICI3 revascularization of occluded RT MCA using 13..4 mg of IA TPA and x1 pass with the SOLITAIRE FR 4mm x 40 mm Retrieval device.   MRI of the brain    2D Echocardiogram    CXR   09/23/2013    1. Endotracheal tube in stable position. 2. No acute cardiopulmonary disease. COPD cannot be excluded. Stable elevation left hemidiaphragm.    09/23/2013    1. Endotracheal tube tip in satisfactory position projecting approximately 5 cm above the carina. 2. Hyperinflation consistent with COPD and/or asthma. Elevation of the left hemidiaphragm. No acute cardiopulmonary disease.     EKG  atrial fibrillation, rate 82.   Therapy Recommendations   Physical Exam   Frail elderly caucasian male not in distress.intubated sedated with bilateral groin sheaths. . Afebrile. Head is nontraumatic. Neck is supple without bruit.  . Cardiac exam no murmur or gallop. Lungs are clear to auscultation. Distal pulses are well felt. Neurological Exam : Sedated but can be easily aroused and follows commands. No gaze deviation and can follow gaze in all directions. Pupils irregular but equal and reactive. Fundi were not visualized. Left lower facial weakness. Tongue is midline. Dense left hemiplegia and will withdraw left lower extremity minimally to painful stimuli. Purposeful antigravity strength on the right side. Left plantar is upgoing and right is downgoing. Tone is diminished on the left. Gait was not tested.  ASSESSMENT Randy Taylor is a 73 y.o. male presenting with acute onset slurred speech and left hemiplegia. Status post IV t-PA 09/22/2013 at 1623. There was complete TICI3 revascularization of occluded RT MCA using IA TPA and SOLITAIRE x 1 pass. Suspect a right subcortical  MCA infarct. Infarct felt to be embolic secondary to new onset atrial fibrillation.  On aspirin, dose unclear prior to admission. Now on no antithrombotics as within 24h for secondary stroke prevention. Patient with resultant VDRF (intubated for intervention), dysphagia - he is able to follow commands, full eye movements, withdraws to pain. Work up underway.  Atrial Fibrillation, new onset  CHA2DS2-VASc Score for Atrial Fibrillation Stroke Risk = 4  (?2 oral anticoagulation recommended);  Adjusted Stroke risk (%/year) 4%   Age in Years:  85-74   +1    Sex:  Male   0    Congestive Heart Failure History:  0  Hypertension History:  0    Stroke/TIA/Thromboembolism History:  +2   Vascular Disease History:  0    Diabetes Mellitus:  yes  +1 hypertension Diabetes, HgbA1c pending, goal < 7.0 Hyperlipidemia, LDL 104, on no statin PTA, now on no statin, goal LDL < 100 (< 70 for diabetics)   Hospital day # 1  TREATMENT/PLAN  Continue ICU level care  Add aspirin for secondary stroke prevention if imaging this afternoon negative for hemorrhage. Fist dose to be given after 1623 and prior to midnight tonight.  Discontinue MRA and carotid doppler  D/c bilateral groin sheaths  Repeat imaging this afternoon - MRI preferred  Ok to extubate prior to imaging if meets CCM criteria  [name], MSN, RN, ANVP-BC, ANP-BC, GNP-BC [facility] Pager: [phone] 09/23/2013 8:43 AM This patient is critically ill and at significant risk of neurological worsening, death and care requires constant monitoring of vital signs, hemodynamics,respiratory and cardiac monitoring,review of multiple databases, neurological assessment, discussion with family, other specialists and medical decision making of high complexity. I spent 40 minutes of neurocritical care time  in the care of  this patient. I have personally obtained a history, examined the patient, evaluated imaging results, and formulated the  assessment and plan of care. I agree with the above. Delia Heady, MD

## 2013-09-23 NOTE — Progress Notes (Signed)
UR completed.  Meridith Romick, RN BSN M[facility] CCM Trauma/Neuro ICU Case Manager [phone]  

## 2013-09-23 NOTE — Progress Notes (Signed)
Pt vomited during sheath pull, was hitting XR tech with rt hand, in discomfort. Fentanyl 50 mcg and zofran 4 mg given. Pt appears comfortable currently.

## 2013-09-23 NOTE — Progress Notes (Signed)
Per Dr. Thad Rangereynolds hold MRI until tomorrow, 09/24/2013.

## 2013-09-23 NOTE — Progress Notes (Signed)
Progress Note: Patient underwent repeat CT scan this afternoon.  Scan reviewed and discussed with radiology.  CT shows progression of right hemispheric infarction involving MCA and ACA distribution.  Midline shift noted as well.  Patient s/p tPA and intervention.   On examination with eyes closed but has spontaneous movement of the right upper extremity.  No forced eye deviation.  Follows commands.   Lengthy discussion had with all 5 daughters explaining current clinical condition and possible future therapies.  It was decided that the patient would start hypertonic saline.  All daughters were in agreement.  They are clear about the possible risks and benefits.    Case discussed with Dr. [name].  Critical care made aware.    Thana FarrLeslie Manveer Gomes, MD [facility] [phone]

## 2013-09-23 NOTE — Progress Notes (Signed)
SLP Cancellation Note  Patient Details Name: Randy PerchesWillard S Taylor MRN: [id number] DOB: 1941/09/01   Cancelled treatment:        Remains intubated at this time.  Will follow along.   Breck CoonsLisa Willis BentleyLitaker M.Ed [employer]P Pager [phone]  09/23/2013

## 2013-09-23 NOTE — Progress Notes (Signed)
Rt 9 french sheath removed v-pad applied, hemostasis reached after 30 min, no complications, 10 lb sandbag applied, reviewed with RN.   4 french left sheath removed, v pad applied, hemostasis reached in 10 min, no complications, reviewed with nurse. Distal pulses 2 bilaterally. Transcribed for [facility] [place] [facility] [place].

## 2013-09-24 ENCOUNTER — Inpatient Hospital Stay (HOSPITAL_COMMUNITY): Payer: Medicare Other

## 2013-09-24 ENCOUNTER — Encounter (HOSPITAL_COMMUNITY): Payer: Self-pay | Admitting: Interventional Radiology

## 2013-09-24 LAB — COMPREHENSIVE METABOLIC PANEL
ALT: 10 U/L (ref 0–53)
AST: 20 U/L (ref 0–37)
Albumin: 2.7 g/dL — ABNORMAL LOW (ref 3.5–5.2)
Alkaline Phosphatase: 64 U/L (ref 39–117)
BUN: 21 mg/dL (ref 6–23)
CO2: 20 mEq/L (ref 19–32)
Calcium: 8.8 mg/dL (ref 8.4–10.5)
Chloride: 113 mEq/L — ABNORMAL HIGH (ref 96–112)
Creatinine, Ser: 1.12 mg/dL (ref 0.50–1.35)
GFR, EST AFRICAN AMERICAN: 73 mL/min — AB (ref >90)
GFR, EST NON AFRICAN AMERICAN: 63 mL/min — AB (ref >90)
Glucose, Bld: 128 mg/dL — ABNORMAL HIGH (ref 70–99)
Potassium: 4.1 mEq/L (ref 3.7–5.3)
SODIUM: 147 meq/L (ref 137–147)
TOTAL PROTEIN: 5.3 g/dL — AB (ref 6.0–8.3)
Total Bilirubin: 0.5 mg/dL (ref 0.3–1.2)

## 2013-09-24 LAB — CBC WITH DIFFERENTIAL/PLATELET
Basophils Absolute: 0 10*3/uL (ref 0.0–0.1)
Basophils Relative: 0 % (ref 0–1)
Eosinophils Absolute: 0 10*3/uL (ref 0.0–0.7)
Eosinophils Relative: 0 % (ref 0–5)
HEMATOCRIT: 37.8 % — AB (ref 39.0–52.0)
HEMOGLOBIN: 12.8 g/dL — AB (ref 13.0–17.0)
LYMPHS ABS: 1.1 10*3/uL (ref 0.7–4.0)
Lymphocytes Relative: 8 % — ABNORMAL LOW (ref 12–46)
MCH: 30.3 pg (ref 26.0–34.0)
MCHC: 33.9 g/dL (ref 30.0–36.0)
MCV: 89.4 fL (ref 78.0–100.0)
MONO ABS: 1.9 10*3/uL — AB (ref 0.1–1.0)
Monocytes Relative: 14 % — ABNORMAL HIGH (ref 3–12)
NEUTROS ABS: 11.1 10*3/uL — AB (ref 1.7–7.7)
NEUTROS PCT: 78 % — AB (ref 43–77)
Platelets: 183 10*3/uL (ref 150–400)
RBC: 4.23 MIL/uL (ref 4.22–5.81)
RDW: 15.7 % — ABNORMAL HIGH (ref 11.5–15.5)
WBC: 14.1 10*3/uL — ABNORMAL HIGH (ref 4.0–10.5)

## 2013-09-24 LAB — GLUCOSE, CAPILLARY
Glucose-Capillary: 113 mg/dL — ABNORMAL HIGH (ref 70–99)
Glucose-Capillary: 125 mg/dL — ABNORMAL HIGH (ref 70–99)
Glucose-Capillary: 125 mg/dL — ABNORMAL HIGH (ref 70–99)
Glucose-Capillary: 138 mg/dL — ABNORMAL HIGH (ref 70–99)

## 2013-09-24 LAB — SODIUM
SODIUM: 141 meq/L (ref 137–147)
Sodium: 147 meq/L (ref 137–147)
Sodium: 151 meq/L — ABNORMAL HIGH (ref 137–147)

## 2013-09-24 MED ORDER — VITAL AF 1.2 CAL PO LIQD
1000.0000 mL | ORAL | Status: DC
  Filled 2013-09-24 (×2): qty 1000

## 2013-09-24 MED ORDER — WHITE PETROLATUM GEL
Status: AC
  Administered 2013-09-24: 0.2
  Filled 2013-09-24: qty 5

## 2013-09-24 MED ORDER — IOHEXOL 350 MG/ML SOLN
50.0000 mL | Freq: Once | INTRAVENOUS | Status: AC | PRN
  Administered 2013-09-24: 50 mL via INTRAVENOUS

## 2013-09-24 MED ORDER — MORPHINE SULFATE 10 MG/ML IJ SOLN
10.0000 mg/h | INTRAVENOUS | Status: DC
  Administered 2013-09-24 – 2013-09-25 (×4): 10 mg/h via INTRAVENOUS
  Administered 2013-09-25: 13 mg/h via INTRAVENOUS
  Administered 2013-09-26: 10 mg/h via INTRAVENOUS
  Filled 2013-09-24 (×6): qty 10

## 2013-09-24 MED ORDER — MORPHINE BOLUS VIA INFUSION
5.0000 mg | INTRAVENOUS | Status: DC | PRN
  Filled 2013-09-24: qty 20

## 2013-09-24 NOTE — Progress Notes (Signed)
SLP Cancellation Note  Patient Details Name: Randy Taylor MRN: [id number] DOB: 21-May-1941   Cancelled treatment:        Remains intubated.  Will f/u next date.   [name] M.Ed [employer] Pager [phone]  09/24/2013

## 2013-09-24 NOTE — Progress Notes (Signed)
2 Days Post-Op  Subjective: Pt with CVA, s/p Rt MCA revasc with IA TPA and clot retrieval 1/13 Pt remains intubated. Family in room.   Objective: Vital signs in last 24 hours: Temp:  [98.6 F (37 C)-98.9 F (37.2 C)] 98.9 F (37.2 C) (01/15 0807) Pulse Rate:  [52-108] 102 (01/15 0807) Resp:  [13-46] 46 (01/15 0807) BP: (90-144)/(53-91) 134/70 mmHg (01/15 0807) SpO2:  [98 %-100 %] 99 % (01/15 0807) FiO2 (%):  [30 %-40 %] 40 % (01/15 0807)    Intake/Output from previous day: 01/14 0701 - 01/15 0700 In: 2347.5 [I.V.:1847.5; IV Piggyback:500] Out: 775 [Urine:775] Intake/Output this shift:    Pt intubated, moving rt side spont, withdraws left forefoot with pain/stimuli, no movement LUE; intermittently following commands; R/L pupils 2mm, sluggish; R/L groin sites soft, no hematomas, feet warm  Lab Results:   Recent Labs  09/23/13 0500 09/24/13 0430  WBC 14.1* 14.1*  HGB 14.2 12.8*  HCT 40.5 37.8*  PLT 238 183   BMET  Recent Labs  09/23/13 0500  09/24/13 0430 09/24/13 0630  NA 134*  < > 147 147  K 4.9  --  4.1  --   CL 101  --  113*  --   CO2 18*  --  20  --   GLUCOSE 165*  --  128*  --   BUN 16  --  21  --   CREATININE 1.13  --  1.12  --   CALCIUM 8.9  --  8.8  --   < > = values in this interval not displayed. PT/INR  Recent Labs  09/22/13 1600  LABPROT 12.8  INR 0.98   ABG  Recent Labs  09/22/13 2130 09/23/13 0500  PHART 7.376 7.343*  HCO3 20.5 19.0*    Studies/Results: Ct Head Wo Contrast  09/23/2013   CLINICAL DATA:  Status post t-PA and right MCA embolectomy. New onset atrial fibrillation. Left hemiplegia.  EXAM: CT HEAD WITHOUT CONTRAST  TECHNIQUE: Contiguous axial images were obtained from the base of the skull through the vertex without contrast.  COMPARISON:  Most recent CT head 09/22/2013. Cerebral angiography also 09/22/2013.  FINDINGS: Marked progression of right hemisphere infarction, with cytotoxic edema now involving most of the right  MCA territory, sparing only a small erosion of the anterior most right frontal lobe, but also now with extensive cytotoxic edema involving nearly all of the right anterior cerebral artery territory including the right frontal and right parietal parasagittal regions. This finding was not present on yesterday's CT, and both anterior cerebral arteries were patent on angiography. There is developing right-to-left shift of 5 mm with slight displacement of the uncus into the suprasellar cistern.  The majority of the hyperattenuation noted in the right frontal cortex and right basal ganglia on 09/22/2013 post angio has resolved consistent with contrast staining the cortex. A small amount of microhemorrhage may be present in the right putamen.  IMPRESSION: Marked progression of right hemisphere infarction now involving most of the right middle cerebral artery as well as nearly all of the right ACA territory. Developing right-to-left shift of 5 mm. Findings discussed with Stroke Service.   Electronically Signed   By: Davonna Belling M.D.   On: 09/23/2013 16:01   Ct Head Wo Contrast  09/22/2013   CLINICAL DATA:  Status post t-PA. Status post right MCA embolectomy. Stroke risk factors include diabetes and hypertension. Left-sided weakness.  EXAM: CT HEAD WITHOUT CONTRAST  TECHNIQUE: Contiguous axial images were obtained from the  base of the skull through the vertex without contrast.  COMPARISON:  Preprocedure CT head at 1402 hr.  FINDINGS: The patient is status post cerebral angiography with revascularization. Vascular opacification proximally with apparent gross patency of both internal carotid arteries and both middle cerebral arteries on this non CTA examination.  Postcontrast enhancement and/or microhemorrhage affecting the right frontal and temporal opercular cortex, right insular cortex, and putamen, as well as the periventricular white matter on the right. It is unclear if this represents pooling of contrast secondary to  disruption of the blood-brain barrier or early microhemorrhage post t-PA, or a combination of both. Repeat CT scan 12 hr from without contrast should help differentiate.  No subarachnoid or intraventricular blood. No significant edema or midline shift. Normal-appearing left hemisphere and posterior fossa structures. Unchanged calvarium and extracranial soft tissues.  IMPRESSION: Postcontrast enhancement and/or microhemorrhage affecting the right hemisphere cortex, white matter, and basal ganglia structures subserved by the right MCA vascular territory. Gross patency of the right anterior circulation has been re-established.  No significant mass effect or midline shift. Continued surveillance is warranted.   Electronically Signed   By: Davonna Belling M.D.   On: 09/22/2013 20:09   Ct Head Wo Contrast  09/22/2013   CLINICAL DATA:  Left-sided weakness.  EXAM: CT HEAD WITHOUT CONTRAST  TECHNIQUE: Contiguous axial images were obtained from the base of the skull through the vertex without intravenous contrast.  COMPARISON:  None.  FINDINGS: Slightly motion degraded exam.  No intracranial hemorrhage.  Dense appearance of the M1 segment of the right middle cerebral artery. This may indicate thrombus or be normal.  Small vessel disease type changes.  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  No intracranial mass lesion noted on this unenhanced exam.  IMPRESSION: Slightly motion degraded exam.  No intracranial hemorrhage.  Dense appearance of the M1 segment of the right middle cerebral artery. This may indicate thrombus or be normal (only minimally different than the left side).  Small vessel disease type changes.  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  These results were called by telephone at the time of interpretation on 09/22/2013 at 4:10 PM to Dr. Joelene Millin who verbally acknowledged these results.   Electronically Signed   By: Bridgett Larsson M.D.   On: 09/22/2013 16:25   Ir Thrombect Prim Mech Init (inclu) Mod  Sed  09/24/2013   CLINICAL DATA:  Acute onset of left-sided hemiplegia, right sided gaze deviation and dysarthria.  EXAM: RIGHT COMMON CAROTID ARTERIOGRAM AND RIGHT VERTEBRAL ARTERY ANGIOGRAM FOLLOWED BY ENDOVASCULAR COMPLETE REVASCULARIZATION OF OCCLUDED RIGHT MIDDLE CEREBRAL ARTERY M1 SEGMENT AND RIGHT ANTERIOR CEREBRAL ARTERY USING MECHANICAL THROMBECTOMY MECHANICAL AND SUPERSELECTIVE INTRACRANIAL INTRA-ARTERIAL tPA INFUSION OF APPROXIMATELY 13.4 MG:  MEDICATIONS: As per anesthesia service.  ANESTHESIA/SEDATION: General anesthesia.  CONTRAST:  OMNIPAQUE IOHEXOL 300 MG/ML  SOLN  PROCEDURE: Following a full explanation of the procedure along with the potential associated complications, an informed witnessed consent was obtained from the patient's daughter.  The right groin was prepped and draped in the usual sterile fashion. Thereafter using modified Seldinger technique, transfemoral access into the right common femoral artery was obtained without difficulty. Over a 0.035 inch guidewire, a 5 French Pinnacle sheath was inserted. Through this and also over a 0.035 inch guidewire, a 5 French JB1 catheter was advanced to the aortic arch region and selectively positioned in the right subclavian artery and right common carotid artery.  Arteriograms were then performed centered extracranially and intracranially. The patient tolerated the procedure well.  COMPLICATIONS: None  immediate.  FINDINGS: The right subclavian arteriogram demonstrates the right vertebral artery origin to be normal.  The vessel opacifies normally to thecranial skull base.  There is normal opacification of the right posterior inferior cerebellar artery and the right vertebrobasilar junction.  The opacified portions of the basilar artery, the superior cerebellar arteries and the anterior inferior cerebellar arteries is grossly normal into the delayed arterial phase.  The right common carotid arteriogram demonstrates the right external carotid  artery and its major branches to be widely patent.  The right internal carotid artery at the bulb has a smooth shallow plaque along the posterior wall.  There is mild tortuosity of the mid cervical right ICA.  More distally the right internal carotid artery is seen to opacify normally to the cranial skull base.  The petrous, the cavernous and the supraclinoid segments are widely patent.  The right posterior communicating artery is seen opacifying the right posterior cerebral artery distribution.  The right anterior cerebral artery proximally is widely patent.  There is cross filling via the anterior communicating artery of the left anterior cerebral artery A2 segment and distally.  There is occlusion of the right anterior cerebral artery at the A2 segment.  The right middle cerebral artery demonstrates complete angiographic occlusion of the M1 segment.  There is minimal distal leptomeningeal collateralization of the right middle cerebral artery distribution or of the right anterior cerebral artery.  Endovascular revascularization of the occluded right middle cerebral artery and the right anterior cerebral artery A2 segment using mechanical thrombectomy device, and super selective intracranial intra-arterial tPA as described.  The angiographic findings were reviewed with the referring neurologist. The option of endovascular revascularization to prevent further neurological injury and to potentially aid in subsequent neurological recovery was discussed with the patient's daughter.  The risks of intracranial hemorrhage of 10-15%, worsening neurological deficit, ventilator dependency, death, and inability to revascularized were all reviewed in detail.  After informed consent, the patient was put under general anesthesia by the Department of Anesthesiology at [facility].  The diagnostic JB1 catheter in the right common carotid artery was then exchanged for a 0.035 inch 300 cm Rosen exchange guidewire for an 8  French 55 cm Brite tip neurovascular sheath using biplane roadmap technique and constant fluoroscopic guidance. Good aspiration was obtained from the side-port of the neurovascular sheath. A gentle contrast injection demonstrated no evidence of spasm, dissections or of intraluminal filling defects . This was then connected to continuous heparinized saline infusion.  Over the [employer] guidewire, an 8 [place] 95 cm Merci balloon guide catheter which had been prepped and purged with 75% contrast and 25% heparinized saline infusion was then advanced and positioned just proximal to the right common carotid artery bifurcation. The guidewire was removed. Good aspiration was obtained from the hub of the 8 [place] Merci guide catheter. A gentle contrast injection demonstrated no evidence of spasm, dissections or of intraluminal filling defects.  Over a 0.035 inch Roadrunner guidewire, and biplane roadmap technique, the 8 [place] guide catheter was then advanced to the junction of the middle and the anterior one-third of the right internal carotid artery. The guidewire was removed. Again good aspiration was obtained from the hub of the 8 [place] Merci guide catheter. A gentle contrast injection demonstrated no evidence of spasm, dissections or of intraluminal filling defects.  A control arteriogram performed through the 8 [place] Merci guide catheter in the right internal carotid artery demonstrated no change in the intracranial circulation.  At this  time a combination of a DAC 125 catheter with a 18L Merci micro catheter combination was advanced over a 0.014 inch Softtip Synchro micro guidewire to the distal end of the guide catheter in the right internal carotid artery.  With the micro guidewire leading with a J-tip configuration, the combination was navigated without difficulty to the supraclinoid right ICA.  The micro guidewire was then gently advanced past the occluded right middle cerebral artery into the inferior  division of the right middle cerebral artery M3 region followed by the micro catheter. The micro guidewire was then removed. Good aspiration was obtained from the hub of the micro catheter. A gentle control arteriogram demonstrated brisk antegrade flow distally.  At this time 10 mg of superselective intracranial intra-arterial tPA was then infused over approximately 10 minutes in 10 cc of normal saline.  At the end of this a controlled arteriogram performed through the 8 [place] Merci guide catheter demonstrated no change in the proximal M1 occlusion.  At this time, a 4 mm x 40 mm Solitaire FR device which had been prepped with heparinized saline infusion in it's housing was then advanced in a coaxial manner and with constant heparinized saline infusion using biplane roadmap technique and constant fluoroscopic guidance to the distal end of the 18L Merci micro catheter and the right middle cerebral artery inferior division.  The O-ring on the delivery micro guidewire in the delivery micro catheter were then released.  With slight forward gentle traction with the right hand on the delivery micro guidewire, with the left hand the delivery micro catheter was then gradually retrieved unsheathing the distal and then the proximal portion of the endovascular retrieval device.  This was left deployed for approximately 5 minutes.  At the end of this, the proximal portion of the stent retrieval device was then captured into the micro catheter.  The occlusion balloon in the right internal carotid artery was then expanded with contrast for proximal flow arrest. Using a 60 mL syringe for constant aspiration at the hub of the 8 [place] Merci guide catheter, the combination of the retrieval device, the 18L microcatheter and the [facility] 125 micro catheter was then retrieved as constant aspiration was continued throughout the process.  The aspiration was continued after removal of this combination and also during and after the deflation of  the flow arrest balloon in the right internal carotid artery.  Back bleed was allowed for a few seconds.  A control arteriogram performed through the 8 [place] Merci guide catheter in the right internal carotid artery demonstrated complete angiographic revascularization of the right middle cerebral artery distribution with patency of the right anterior cerebral artery proximally and also of the right posterior cerebral artery.  Moderate spasm in the proximal right middle cerebral artery responded to 25 mics of intra-arterial nitroglycerin.  A close inspection demonstrated filling defect in the proximal A2 segment of the dominant right anterior cerebral artery. This was initially also noted on the pretreatment arteriogram.  Using biplane roadmap technique and constant fluoroscopic guidance, in a coaxial manner and with constant heparinized saline perfusion, the 18L Merci micro catheter was then advanced to the right A1 segment.  The micro guidewire was then gently manipulated and advanced into the bulk of the clot in the A2 segment of the right anterior cerebral artery. Approximately 3 mg of super- selective intracranial intra-arterial tPA was then infused over approximately 5 minutes in 3 mL of normal saline.  At the end of this, a control arteriogram performed through  the micro catheter demonstrated complete recanalization of the right anterior cerebral artery A2 segment and distally.  The micro catheter was then gently retrieved and removed.  A final control arteriogram performed through the 8 [place] Merci guide catheter in the right internal carotid artery demonstrated a complete revascularization of the right middle cerebral artery distribution, the right anterior cerebral artery distribution and with patency of the right posterior cerebral artery. The 8 [place] Merci guide catheter and the 8 French neurovascular sheath were then retrieved into the abdominal aorta and exchanged over a J-tip guidewire for a 9  [place] Pinnacle sheath. This was then connected to continuous heparinized saline infusion.  The patient was then transported to the CT scanner for postprocedural CT scan of the brain.  IMPRESSION: Status post endovascular complete revascularization of the right middle cerebral artery using approximately 10 mg of superselective intracranial intra-arterial tPA, and one pass with the Solitaire FR retrieval device.  Status post endovascular complete revascularization of the right anterior cerebral artery A2 segment and distally using approximately 3 mg of superselective intracranial intra-arterial tPA infusion.   Electronically Signed   By: Julieanne CottonSanjeev  Deveshwar M.D.   On: 09/23/2013 13:53   Dg Chest Port 1 View  09/24/2013   CLINICAL DATA:  Atelectasis evaluation.  EXAM: PORTABLE CHEST - 1 VIEW  COMPARISON:  09/23/2013  FINDINGS: Endotracheal tube is 3.9 cm above the carina. Nasogastric tube extends into the abdomen. Central line in the SVC region. Lungs are clear without focal airspace disease or edema. Heart size is within normal limits.  IMPRESSION: No focal chest disease.  Support apparatuses as described.   Electronically Signed   By: Richarda OverlieAdam  Henn M.D.   On: 09/24/2013 07:56   Dg Chest Port 1 View  09/23/2013   CLINICAL DATA:  Central line placement.  EXAM: PORTABLE CHEST - 1 VIEW  COMPARISON:  Earlier film, same date.  FINDINGS: The endotracheal tube is 4.5 cm above the carina. The NG tube is coursing down the esophagus and into the stomach. The right IJ central venous catheter tip is just below the carina at the mid SVC level. No complicating features. The heart and lungs are stable.  IMPRESSION: Right IJ central venous catheter in good position without complicating features.   Electronically Signed   By: Loralie ChampagneMark  Gallerani M.D.   On: 09/23/2013 19:49   Dg Chest Port 1 View  09/23/2013   CLINICAL DATA:  Intubation.  EXAM: PORTABLE CHEST - 1 VIEW  COMPARISON:  09/22/2013.  FINDINGS: Endotracheal tube in stable  position. Lungs are clear. COPD cannot be excluded. Heart size normal. No pleural effusion or pneumothorax identified. Lower most portions of the lung bases are not imaged. Elevation left hemidiaphragm.  IMPRESSION: 1. Endotracheal tube in stable position. 2. No acute cardiopulmonary disease. COPD cannot be excluded. Stable elevation left hemidiaphragm.   Electronically Signed   By: Maisie Fushomas  Register   On: 09/23/2013 07:04   Dg Chest Port 1 View  09/23/2013   CLINICAL DATA:  Post transcatheter recanalization of occluded right middle cerebral artery. Intubation.  EXAM: PORTABLE CHEST - 1 VIEW  COMPARISON:  None.  FINDINGS: Endotracheal tube tip in satisfactory position projecting approximately 5 cm above the carina. Cardiac silhouette normal in size for the AP semi-erect technique. Hyperinflated right lung. Elevation of the left hemidiaphragm. Lungs clear. Bronchovascular markings normal. Pulmonary vascularity normal. No visible pleural effusions. No pneumothorax.  IMPRESSION: 1. Endotracheal tube tip in satisfactory position projecting approximately 5 cm above the carina. 2.  Hyperinflation consistent with COPD and/or asthma. Elevation of the left hemidiaphragm. No acute cardiopulmonary disease.   Electronically Signed   By: Hulan Saas M.D.   On: 09/23/2013 02:19   Dg Abd Portable 1v  09/23/2013   CLINICAL DATA:  NG tube placement.  EXAM: PORTABLE ABDOMEN - 1 VIEW  COMPARISON:  None.  FINDINGS: The NG tube tip is in the antropyloric region of the stomach. No free air is identified.  IMPRESSION: NG tube tip is in the antropyloric region of the stomach.   Electronically Signed   By: Loralie Champagne M.D.   On: 09/23/2013 19:49    Anti-infectives: Anti-infectives   None      Assessment/Plan: S/p CVA with Rt MCA revasc with IA TPA and clot retrieval 1/13; check CT angio today    LOS: 2 days    Quin Mcpherson,D [facility] 09/24/2013

## 2013-09-24 NOTE — Progress Notes (Signed)
Annie MainSharon Biby, NP notified of family's wishes for patient to be made comfort care and to be extubated. Awaiting orders. Will monitor.

## 2013-09-24 NOTE — Progress Notes (Signed)
PT Cancellation Note  Patient Details Name: Randy PerchesWillard S Hanel MRN: [id number] DOB: 08-13-1941   Cancelled Treatment:    Reason Eval/Treat Not Completed: Patient not medically ready . Per RN pt continues to be on bedrest and not medically stable for therapy. CT scan yesterday showed progression of Rt hemispheric infarction involving MCA and ACA distrubtion. Will re-attempt when pt is medically stable.    Donnamarie PoagWest, [place], [place]PT [phone] 09/24/2013, 8:43 AM

## 2013-09-24 NOTE — Progress Notes (Signed)
Stroke Team Progress Note  HISTORY Randy Taylor is an 73 y.o. male with a history of hypertension and diabetes mellitus who experienced acute onset of slurred speech and left hemiplegia at 2:45 PM today 1/113/2015. No previous history of stroke nor TIA. Patient has been taking aspirin daily. CT scan of his head appeared to be increased density involving the right proximal middle cerebral artery. 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 NIH stroke score was 19. Patient was deemed a candidate for intravenous TPA which was administered. Family consented for revascularization procedures in IR. Dr. Corliss Skains obtained complete TICI3 revascularization of occluded RT MCA using 13.4 mg of IA TPA and x1 pass with the SOLITAIRE FR 4mm x 40 mm Retrieval device.  He was admitted to the neuro ICU for further evaluation and treatment.  SUBJECTIVE Multiple family members are at the bedside. They report coumadin stopped 6-7 mos ago, has been on aspirin since.  OBJECTIVE Most recent Vital Signs: Filed Vitals:   09/24/13 0600 09/24/13 0630 09/24/13 0700 09/24/13 0806  BP: 137/79 141/73 130/68   Pulse: 87 100 99   Temp:      TempSrc:      Resp: 14 18 15    Height:      Weight:      SpO2: 100% 100% 100% 100%   CBG (last 3)   Recent Labs  09/23/13 1943 09/24/13 0005 09/24/13 0414  GLUCAP 129* 138* 113*    IV Fluid Intake:   . sodium chloride 40 mL/hr at 09/24/13 0600  . sodium chloride (hypertonic) 75 mL/hr at 09/24/13 0600    MEDICATIONS  . albuterol  2.5 mg Nebulization Q6H  . antiseptic oral rinse  15 mL Mouth Rinse q12n4p  . aspirin  300 mg Rectal Daily  . chlorhexidine  15 mL Mouth Rinse BID  . insulin aspart  0-9 Units Subcutaneous Q4H  . midazolam  2 mg Intravenous Once  . pantoprazole (PROTONIX) IV  40 mg Intravenous QHS   PRN:  acetaminophen, acetaminophen, fentaNYL, labetalol, ondansetron (ZOFRAN) IV  Diet:  NPO  Activity:  Bedrest DVT  Prophylaxis:  SCDs   CLINICALLY SIGNIFICANT STUDIES Basic Metabolic Panel:   Recent Labs Lab 09/22/13 1611 09/23/13 0500  09/24/13 0430 09/24/13 0630  NA 135* 134*  < > 147 147  K 4.3 4.9  --  4.1  --   CL 98 101  --  113*  --   CO2  --  18*  --  20  --   GLUCOSE 112* 165*  --  128*  --   BUN 11 16  --  21  --   CREATININE 1.20 1.13  --  1.12  --   CALCIUM  --  8.9  --  8.8  --   MG  --  1.8  --   --   --   PHOS  --  3.8  --   --   --   < > = values in this interval not displayed. Liver Function Tests:   Recent Labs Lab 09/22/13 1600 09/24/13 0430  AST 18 20  ALT 9 10  ALKPHOS 82 64  BILITOT 0.4 0.5  PROT 7.1 5.3*  ALBUMIN 3.8 2.7*   CBC:   Recent Labs Lab 09/23/13 0500 09/24/13 0430  WBC 14.1* 14.1*  NEUTROABS 12.5* 11.1*  HGB 14.2 12.8*  HCT 40.5 37.8*  MCV 88.2 89.4  PLT 238 183   Coagulation:   Recent Labs Lab 09/22/13  1600  LABPROT 12.8  INR 0.98   Cardiac Enzymes:   Recent Labs Lab 09/22/13 1600  TROPONINI <0.30   Urinalysis:   Recent Labs Lab 09/22/13 1632  COLORURINE YELLOW  LABSPEC 1.015  PHURINE 6.5  GLUCOSEU NEGATIVE  HGBUR NEGATIVE  BILIRUBINUR NEGATIVE  KETONESUR NEGATIVE  PROTEINUR 100*  UROBILINOGEN 1.0  NITRITE NEGATIVE  LEUKOCYTESUR NEGATIVE   Lipid Panel    Component Value Date/Time   CHOL 158 09/23/2013 0500   TRIG 70 09/23/2013 0500   HDL 40 09/23/2013 0500   CHOLHDL 4.0 09/23/2013 0500   VLDL 14 09/23/2013 0500   LDLCALC 104* 09/23/2013 0500   HgbA1C  Lab Results  Component Value Date   HGBA1C 5.9* 09/23/2013    Urine Drug Screen:     Component Value Date/Time   LABOPIA NONE DETECTED 09/22/2013 1632   COCAINSCRNUR NONE DETECTED 09/22/2013 1632   LABBENZ NONE DETECTED 09/22/2013 1632   AMPHETMU NONE DETECTED 09/22/2013 1632   THCU NONE DETECTED 09/22/2013 1632   LABBARB NONE DETECTED 09/22/2013 1632    Alcohol Level:   Recent Labs Lab 09/22/13 1600  ETH <11    CT of the brain   09/23/2013 Marked  progression of right hemisphere infarction now involving most of the right middle cerebral artery as well as nearly all of the right ACA territory. Developing right-to-left shift of 5 mm.  09/22/2013   Postcontrast enhancement and/or microhemorrhage affecting the right hemisphere cortex, white matter, and basal ganglia structures subserved by the right MCA vascular territory. Gross patency of the right anterior circulation has been re-established.  No significant mass effect or midline shift. Continued surveillance is warranted.  09/22/2013    Slightly motion degraded exam.  No intracranial hemorrhage.  Dense appearance of the M1 segment of the right middle cerebral artery. This may indicate thrombus or be normal (only minimally different than the left side).  Small vessel disease type changes.  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    Cerebral Angio 09/22/2013 complete TICI3 revascularization of occluded RT MCA using 13..4 mg of IA TPA and x1 pass with the SOLITAIRE FR 4mm x 40 mm Retrieval device.   MRI of the brain    2D Echocardiogram    CXR   09/14/2013 No focal chest disease. Support apparatuses. 09/23/2013    1. Endotracheal tube in stable position. 2. No acute cardiopulmonary disease. COPD cannot be excluded. Stable elevation left hemidiaphragm.    09/23/2013    1. Endotracheal tube tip in satisfactory position projecting approximately 5 cm above the carina. 2. Hyperinflation consistent with COPD and/or asthma. Elevation of the left hemidiaphragm. No acute cardiopulmonary disease.     EKG  atrial fibrillation, rate 82.   Therapy Recommendations   Physical Exam   Frail elderly caucasian male not in distress.intubated sedated with bilateral groin sheaths. . Afebrile. Head is nontraumatic. Neck is supple without bruit.  . Cardiac exam no murmur or gallop. Lungs are clear to auscultation. Distal pulses are well felt. Neurological Exam : Sedated but can be   aroused and follows few commands. No gaze  deviation and can follow gaze in  Horizontal direction. Pupils irregular but equal and reactive. Fundi were not visualized. Left lower facial weakness. Tongue is midline. Dense left hemiplegia and will withdraw left lower extremity minimally to painful stimuli. Purposeful antigravity strength on the right side. Left plantar is upgoing and right is downgoing. Tone is diminished on the left. Gait was not tested.   ASSESSMENT Randy Taylor  is a 73 y.o. male presenting with acute onset slurred speech and left hemiplegia. Status post IV t-PA 09/22/2013 at 1623. There was complete TICI3 revascularization of occluded RT MCA using IA TPA and SOLITAIRE x 1 pass. 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 Patient with cytotoxic cerebral edema and midline shift 5mm. Infarct felt to be embolic secondary to new onset atrial fibrillation.  On aspirin, dose unclear prior to admission. Now on aspirin 300 mg suppository for secondary stroke prevention. Patient with neuro worsening over night; stroke larger than expected, most likely due to new ICA occlusion. Patient with resultant VDRF (intubated for intervention), dysphagia, lethargy, less consistent with being able to follow commands, withdraws to pain. Work up underway.  Induced hypernatremia with 3% saline protocol in order to decrease cerebral edema, has been running over night with gradual increase. There has been no additional NS running. Atrial Fibrillation, new onset  CHA2DS2-VASc Score for Atrial Fibrillation Stroke Risk = 4  (?2 oral anticoagulation recommended);  Yearly Stroke risk:  4%   Age in Years:  75-74   +1    Sex:  Male   0    Congestive Heart Failure History:  0    Hypertension History:  0    Stroke/TIA/Thromboembolism History:  +2   Vascular Disease History:  0    Diabetes Mellitus:  yes  +1 hypertension Diabetes, HgbA1c 5.9, goal < 7.0 Hyperlipidemia, LDL 104, on no  statin PTA, now on no statin, goal LDL < 100 (< 70 for diabetics)   Hospital day # 2  TREATMENT/PLAN  Continue ICU level care  Continue aspirin 300 mg suppository for secondary stroke prevention.   CT angio head and neck; cancel MRI  DNR - continue aggressive care  Dr. Pearlean Brownie discussed diagnosis, prognosis,  treatment options and plan of care in detail with daughters/family. Included DNR discussion.   [name], MSN, RN, ANVP-BC, ANP-BC, [facility] Pager: [phone] 09/24/2013 8:09 AM  This patient is critically ill and at significant risk of neurological worsening, death and care requires constant monitoring of vital signs, hemodynamics,respiratory and cardiac monitoring,review of multiple databases, neurological assessment, discussion with family, other specialists and medical decision making of high complexity. I spent 30 minutes of neurocritical care time  in the care of  this patient.  I have personally obtained a history, examined the patient, evaluated imaging results, and formulated the assessment and plan of care. I agree with the above. Delia Heady, MD

## 2013-09-24 NOTE — Progress Notes (Signed)
Family has opted for comfort care. Withdrawal/terminal wean orders placed. If survives extubation, will transfer to floor for comfort care.  Annie MainSHARON BIBY, MSN, RN, ANVP-BC, ANP-BC, GNP-BC [facility] Pager: [phone] 09/24/2013 4:19 PM  I have personally  evaluated imaging results, and formulated the assessment and plan of care. I agree with the above. Delia HeadyPramod Sethi, MD

## 2013-09-24 NOTE — Procedures (Signed)
Extubation Procedure Note  Patient Details:   Name: Tora PerchesWillard S Reedy DOB: [birth year] MRN: [id number]   Airway Documentation:     Evaluation  O2 sats: stable throughout Complications: No apparent complications Patient did tolerate procedure well. Bilateral Breath Sounds: Diminished Suctioning: Airway No  Pt terminally extubated per MD order.  RN at bedside.  Pt is a DNR.  RT will continue to monitor.  Closson, Terie PurserMorgan Shonte Soderlund 09/24/2013, 5:17 PM

## 2013-09-24 NOTE — Progress Notes (Signed)
OT Cancellation Note  Patient Details Name: Randy Taylor MRN: [id number] DOB: June 16, 1941   Cancelled Treatment:    Reason Eval/Treat Not Completed: Patient not medically ready - will check back and initiate OT eval once medically stable.  Jeani HawkingWendi Daiden Coltrane, OTR/L [phone]  09/24/2013, 9:59 AM

## 2013-09-24 NOTE — Progress Notes (Signed)
INITIAL NUTRITION ASSESSMENT  DOCUMENTATION CODES Per approved criteria  -Not Applicable   INTERVENTION: Initiate Vital AF 1.2 @ 15 ml/hr and increase by 10 ml every 4 hours to goal rate of 55 ml/hr.   Tube feeding regimen provides 1584 kcal, 99 grams of protein, and 1070 ml of H2O.    NUTRITION DIAGNOSIS: Inadequate oral intake related to inability to eat as evidenced by NPO status.  Goal: Pt to meet >/= 90% of their estimated nutrition needs   Monitor:  Vent status, TF initiation and tolerance, weight trend, labs   Reason for Assessment: Consult received to initiate and manage enteral nutrition support.  73 y.o. male  Admitting Dx: <principal problem not specified>  ASSESSMENT: Pt admitted with acute right MCA stroke s/p t-PA and IR clot retrieval 1/13. CT on 1/14 showed R>L midline shift. 1/14 hypertonic saline started. Pt possibly with new dx of afib. CT pending. Per notes plan of care discussions ongoing with pt's family. Aggressive care for now.  Patient is currently intubated on ventilator support.  MV: 8 L/min Temp (24hrs), Avg:98.7 F (37.1 C), Min:98.6 F (37 C), Max:98.9 F (37.2 C)  Per RN pt having new IV placed currently. Family visibly upset. Possible plan to withdrawal today pending results of CT/angio. Will defer any interview or physical exam for now.   Height: Ht Readings from Last 1 Encounters:  09/22/13 5\' 9"  (1.753 m)    Weight: Wt Readings from Last 1 Encounters:  09/22/13 144 lb 6.4 oz (65.499 kg)    Ideal Body Weight: 72.7 kg   % Ideal Body Weight: 90%  Wt Readings from Last 10 Encounters:  09/22/13 144 lb 6.4 oz (65.499 kg)  09/22/13 144 lb 6.4 oz (65.499 kg)    Usual Body Weight: unknown  % Usual Body Weight: -  BMI:  Body mass index is 21.31 kg/(m^2).  Estimated Nutritional Needs: Kcal: 1570 Protein: 85-100 grams Fluid: > 1.5 L/day  Skin: no issues noted  Diet Order: NPO  EDUCATION NEEDS: -No education needs  identified at this time   Intake/Output Summary (Last 24 hours) at 09/24/13 1019 Last data filed at 09/24/13 0600  Gross per 24 hour  Intake 2347.5 ml  Output    715 ml  Net 1632.5 ml    Last BM: PTA   Labs:   Recent Labs Lab 09/22/13 1600 09/22/13 1611 09/23/13 0500  09/23/13 2313 09/24/13 0430 09/24/13 0630  NA 133* 135* 134*  < > 141 147 147  K 4.4 4.3 4.9  --   --  4.1  --   CL 95* 98 101  --   --  113*  --   CO2 28  --  18*  --   --  20  --   BUN 11 11 16   --   --  21  --   CREATININE 1.03 1.20 1.13  --   --  1.12  --   CALCIUM 9.5  --  8.9  --   --  8.8  --   MG  --   --  1.8  --   --   --   --   PHOS  --   --  3.8  --   --   --   --   GLUCOSE 111* 112* 165*  --   --  128*  --   < > = values in this interval not displayed.  CBG (last 3)   Recent Labs  09/24/13 0005 09/24/13  0414 09/24/13 0819  GLUCAP 138* 113* 125*   Lab Results  Component Value Date   HGBA1C 5.9* 09/23/2013   Scheduled Meds: . albuterol  2.5 mg Nebulization Q6H  . antiseptic oral rinse  15 mL Mouth Rinse q12n4p  . aspirin  300 mg Rectal Daily  . chlorhexidine  15 mL Mouth Rinse BID  . insulin aspart  0-9 Units Subcutaneous Q4H  . midazolam  2 mg Intravenous Once  . pantoprazole (PROTONIX) IV  40 mg Intravenous QHS    Continuous Infusions: . feeding supplement (VITAL AF 1.2 CAL)    . sodium chloride (hypertonic) 75 mL/hr at 09/24/13 0600    Past Medical History  Diagnosis Date  . Diabetes mellitus without complication   . Hypertension     Past Surgical History  Procedure Laterality Date  . Radiology with anesthesia N/A 09/22/2013    Procedure: RADIOLOGY WITH ANESTHESIA;  Surgeon: Oneal Grout, MD;  Location: [facility] OR;  Service: Radiology;  Laterality: N/A;    Kendell Bane RD, LDN, CNSC [phone] Pager [phone] After Hours Pager

## 2013-09-24 NOTE — Progress Notes (Signed)
[facility] PCCM  Name: Randy Taylor MRN: [id number] DOB: 14-Jul-1941    ADMISSION DATE:  09/22/2013 CONSULTATION DATE:  09/22/13  REFERRING MD :  Dr. Roseanne RenoStewart PRIMARY SERVICE:  Neurology  CHIEF COMPLAINT:  Slurred speech, left-sided weakness  BRIEF PATIENT DESCRIPTION: The patient is a 73 yo man, history of HTN, DM, presenting 1/13 with slurred speech, left-sided weakness, and left-sided neglect, found to have R MCA occlusion by CT and angiogram, given TPA and taken to IR for TPA and clot retrieval.  SIGNIFICANT EVENTS / STUDIES:  1/13 ct head>>>m1 segment infarct 1/13 - admitted for acute R MCA stroke, s/p TPA and IR clot retrieval 1/14 ct head>>Postcontrast enhancement and/or microhemorrhage affecting the right hemisphere cortex, white matter, and basal ganglia structuressubserved by the right MCA vascular territory; R > L midline shift 5mm 1/14 hypertonic saline started 1/15 CT angio brain >>  LINES / TUBES: ETT 1/13 >> Foley 1/13>> R IJ CVL 1/14 >>  CULTURES: None  ANTIBIOTICS: None  SUBJECTIVE: discussion with family by neurology> continue supportive measures for now, hypertonic saline for cerebral edema, no chest compressions/CPR; vomiting improved  VITAL SIGNS: Temp:  [98.6 F (37 C)-98.9 F (37.2 C)] 98.9 F (37.2 C) (01/15 0807) Pulse Rate:  [52-108] 102 (01/15 0807) Resp:  [13-46] 46 (01/15 0807) BP: (90-144)/(53-91) 134/70 mmHg (01/15 0807) SpO2:  [98 %-100 %] 99 % (01/15 0807) Arterial Line BP: (105-106)/(70-79) 105/79 mmHg (01/14 1030) FiO2 (%):  [30 %-40 %] 40 % (01/15 0807) HEMODYNAMICS:   VENTILATOR SETTINGS: Vent Mode:  [-] PRVC FiO2 (%):  [30 %-40 %] 40 % Set Rate:  [14 bmp] 14 bmp Vt Set:  [560 mL] 560 mL PEEP:  [5 cmH20] 5 cmH20 Pressure Support:  [5 cmH20] 5 cmH20 Plateau Pressure:  [13 cmH20-16 cmH20] 13 cmH20 INTAKE / OUTPUT: Intake/Output     01/14 0701 - 01/15 0700 01/15 0701 - 01/16 0700   I.V. (mL/kg) 1847.5 (28.2)    IV Piggyback 500     Total Intake(mL/kg) 2347.5 (35.8)    Urine (mL/kg/hr) 775 (0.5)    Total Output 775     Net +1572.5          Emesis Occurrence 2 x      PHYSICAL EXAMINATION: General: comfortable on vent HEENT: ETT in place, NCAT PULM: CTA B CV: Irreg irreg, no mgr AB: BS+, soft, nontender Ext: warm, no edema Neuro: sedated on vent   LABS:  CBC  Recent Labs Lab 09/22/13 1600 09/22/13 1611 09/23/13 0500 09/24/13 0430  WBC 9.7  --  14.1* 14.1*  HGB 16.6 17.7* 14.2 12.8*  HCT 46.8 52.0 40.5 37.8*  PLT 259  --  238 183   Coag's  Recent Labs Lab 09/22/13 1600  APTT 29  INR 0.98   BMET  Recent Labs Lab 09/22/13 1600 09/22/13 1611 09/23/13 0500  09/23/13 2313 09/24/13 0430 09/24/13 0630  NA 133* 135* 134*  < > 141 147 147  K 4.4 4.3 4.9  --   --  4.1  --   CL 95* 98 101  --   --  113*  --   CO2 28  --  18*  --   --  20  --   BUN 11 11 16   --   --  21  --   CREATININE 1.03 1.20 1.13  --   --  1.12  --   GLUCOSE 111* 112* 165*  --   --  128*  --   < > =  values in this interval not displayed. Electrolytes  Recent Labs Lab 09/22/13 1600 09/23/13 0500 09/24/13 0430  CALCIUM 9.5 8.9 8.8  MG  --  1.8  --   PHOS  --  3.8  --    Sepsis Markers No results found for this basename: LATICACIDVEN, PROCALCITON, O2SATVEN,  in the last 168 hours ABG  Recent Labs Lab 09/22/13 2130 09/23/13 0500  PHART 7.376 7.343*  PCO2ART 35.7 35.9  PO2ART 87.6 146.0*   Liver Enzymes  Recent Labs Lab 09/22/13 1600 09/24/13 0430  AST 18 20  ALT 9 10  ALKPHOS 82 64  BILITOT 0.4 0.5  ALBUMIN 3.8 2.7*   Cardiac Enzymes  Recent Labs Lab 09/22/13 1600  TROPONINI <0.30   Glucose  Recent Labs Lab 09/23/13 1155 09/23/13 1558 09/23/13 1943 09/24/13 0005 09/24/13 0414 09/24/13 0819  GLUCAP 140* 119* 129* 138* 113* 125*    Imaging   CXR: improved tax left, ett wnl, no infiltrate  ASSESSMENT / PLAN:  PULMONARY  A: Acute resp failure due to inability to protect  airway P:  -HOB > 30 degree -full vent support -SBT pending neurologic progress  CARDIOVASCULAR  A: HTN  Atria Fibrillation- currently Rate controlled, ?new diagnosis, acute cva P:  - dig/amio if needed for rate control - Carotid dopplers.  - F/u on Echo.   RENAL  A: Hyponatremia resolved P:  - hypernatremia given cerebral edema - chem in am   GASTROINTESTINAL  A: Vomiting resolved P:  - Pantoprazole- 40mg  daily.  - Zofran- 4mg  Q6H.  - start tube feeding 1/15  HEMATOLOGIC  A: Small amount micorhem ct, fib P:  - SCDs, for now, no Anticoag.  - will discuss with Neuro timing  / role anticoagulation in future with new fib - F/U Echo for clot  INFECTIOUS  A: No acute issues P:  - Monitor fever curve and WBC. - pcxr in am   ENDOCRINE  A: Diabetes Mellitus  P:  - CBGs- Q4H, can add SSI if elevated. - HBA1c.  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 new afib as cause of CVA? Cerebral edema, midline shift P:  - Fentanyl to rass -1 - avoid benzo - Neuro Checks Q2h.  - CT angio 1/15 - hypertonic saline  TODAY'S SUMMARY: holding off on extubation pending CT angio; neurology discussing prognosis with family;   Code: DNR, palliative extubation pending neurologic progress with hypertonic saline  I have personally obtained a history, examined the patient, evaluated laboratory and imaging results, formulated the assessment and plan and placed orders.  CRITICAL CARE: The patient is critically ill with multiple organ systems failure and requires high complexity decision making for assessment and support, frequent evaluation and titration of therapies, application of advanced monitoring technologies and extensive interpretation of multiple databases. Critical Care Time devoted to patient care services described in this note is 35 minutes.   Yolonda Kida PCCM Pager: [phone] Cell: [phone] If no response, call [phone]

## 2013-09-25 DIAGNOSIS — I6350 Cerebral infarction due to unspecified occlusion or stenosis of unspecified cerebral artery: Secondary | ICD-10-CM | POA: Diagnosis not present

## 2013-09-25 DIAGNOSIS — J9600 Acute respiratory failure, unspecified whether with hypoxia or hypercapnia: Secondary | ICD-10-CM | POA: Diagnosis not present

## 2013-09-25 MED ORDER — SCOPOLAMINE 1 MG/3DAYS TD PT72
1.0000 | MEDICATED_PATCH | TRANSDERMAL | Status: DC
Start: 2013-09-25 — End: 2013-09-26
  Administered 2013-09-25: 1.5 mg via TRANSDERMAL
  Filled 2013-09-25: qty 1

## 2013-09-25 MED ORDER — ATROPINE SULFATE 1 % OP SOLN
4.0000 [drp] | OPHTHALMIC | Status: DC | PRN
  Administered 2013-09-25 – 2013-09-26 (×5): 4 [drp] via SUBLINGUAL
  Filled 2013-09-25: qty 2

## 2013-09-25 MED ORDER — LORAZEPAM 2 MG/ML IJ SOLN
1.0000 mg | INTRAMUSCULAR | Status: DC | PRN
  Administered 2013-09-26 (×2): 1 mg via INTRAVENOUS
  Filled 2013-09-25 (×2): qty 1

## 2013-09-25 MED ORDER — MORPHINE BOLUS VIA INFUSION
2.0000 mg | INTRAVENOUS | Status: DC | PRN
  Filled 2013-09-25: qty 2

## 2013-09-25 NOTE — Progress Notes (Addendum)
Randy Taylor is surrounded by all his familyDayton Taylor. He has copious terminal secretions in which he is unable to maintain. I did successfully orally suction copious yellow/green secretions. His lungs are rhonchi and course throughout. His breathing is slightly labored. I spoke with the family about comfort care and the process of becoming a hospice patient in the hospital and answered their questions. Randy Taylor is actively dying and too unstable to move.   Symptom Management: 1. Pain/Dyspnea: Maintain Morphine infusion with bolus prn. I also ordered Ativan prn. 2. Terminal Secretions: Scopolamine in place. Atropine SL prn.    Consult note to follow.  Randy ChannelAlicia Haiden Clucas, NP Palliative Medicine Team Team Phone # [phone]

## 2013-09-25 NOTE — Progress Notes (Signed)
Stroke Team Progress Note  HISTORY Randy Taylor is an 73 y.o. male with a history of hypertension and diabetes mellitus who experienced acute onset of slurred speech and left hemiplegia at 2:45 PM today 1/113/2015. No previous history of stroke nor TIA. Patient has been taking aspirin daily. CT scan of his head appeared to be increased density involving the right proximal middle cerebral artery. 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 NIH stroke score was 19. Patient was deemed a candidate for intravenous TPA which was administered. Family consented for revascularization procedures in IR. Dr. Corliss Skains obtained complete TICI3 revascularization of occluded RT MCA using 13.4 mg of IA TPA and x1 pass with the SOLITAIRE FR 4mm x 40 mm Retrieval device, RT ACA occlusion also revascularized.  He was admitted to the neuro ICU for further evaluation and treatment.  SUBJECTIVE Patient made comfort care yesterday by family. He is on morphine drip. There are multiple family members at the bedside  OBJECTIVE Most recent Vital Signs: Filed Vitals:   09/24/13 1500 09/24/13 1914 09/24/13 2149 09/25/13 0349  BP: 141/87 144/73 120/66 125/60  Pulse: 90 95 104 107  Temp:  98.1 F (36.7 C) 99.2 F (37.3 C)   TempSrc:  Axillary Axillary   Resp: 17 10 10 7   Height: 5\' 9"  (1.753 m)     Weight: 65.49 kg (144 lb 6.1 oz)     SpO2: 100% 97% 97% 98%   CBG (last 3)   Recent Labs  09/24/13 0414 09/24/13 0819 09/24/13 1156  GLUCAP 113* 125* 125*    IV Fluid Intake:   . morphine 10 mg/hr (09/25/13 1610)    MEDICATIONS  . midazolam  2 mg Intravenous Once   PRN:  fentaNYL, morphine  Diet:  NPO  Activity:  Bedrest DVT Prophylaxis:  SCDs   CLINICALLY SIGNIFICANT STUDIES Basic Metabolic Panel:   Recent Labs Lab 09/22/13 1611 09/23/13 0500  09/24/13 0430 09/24/13 0630 09/24/13 1350  NA 135* 134*  < > 147 147 151*  K 4.3 4.9  --  4.1  --   --   CL 98 101  --   113*  --   --   CO2  --  18*  --  20  --   --   GLUCOSE 112* 165*  --  128*  --   --   BUN 11 16  --  21  --   --   CREATININE 1.20 1.13  --  1.12  --   --   CALCIUM  --  8.9  --  8.8  --   --   MG  --  1.8  --   --   --   --   PHOS  --  3.8  --   --   --   --   < > = values in this interval not displayed. Liver Function Tests:   Recent Labs Lab 09/22/13 1600 09/24/13 0430  AST 18 20  ALT 9 10  ALKPHOS 82 64  BILITOT 0.4 0.5  PROT 7.1 5.3*  ALBUMIN 3.8 2.7*   CBC:   Recent Labs Lab 09/23/13 0500 09/24/13 0430  WBC 14.1* 14.1*  NEUTROABS 12.5* 11.1*  HGB 14.2 12.8*  HCT 40.5 37.8*  MCV 88.2 89.4  PLT 238 183   Coagulation:   Recent Labs Lab 09/22/13 1600  LABPROT 12.8  INR 0.98   Cardiac Enzymes:   Recent Labs Lab 09/22/13 1600  TROPONINI <0.30  Urinalysis:   Recent Labs Lab 09/22/13 1632  COLORURINE YELLOW  LABSPEC 1.015  PHURINE 6.5  GLUCOSEU NEGATIVE  HGBUR NEGATIVE  BILIRUBINUR NEGATIVE  KETONESUR NEGATIVE  PROTEINUR 100*  UROBILINOGEN 1.0  NITRITE NEGATIVE  LEUKOCYTESUR NEGATIVE   Lipid Panel    Component Value Date/Time   CHOL 158 09/23/2013 0500   TRIG 70 09/23/2013 0500   HDL 40 09/23/2013 0500   CHOLHDL 4.0 09/23/2013 0500   VLDL 14 09/23/2013 0500   LDLCALC 104* 09/23/2013 0500   HgbA1C  Lab Results  Component Value Date   HGBA1C 5.9* 09/23/2013    Urine Drug Screen:     Component Value Date/Time   LABOPIA NONE DETECTED 09/22/2013 1632   COCAINSCRNUR NONE DETECTED 09/22/2013 1632   LABBENZ NONE DETECTED 09/22/2013 1632   AMPHETMU NONE DETECTED 09/22/2013 1632   THCU NONE DETECTED 09/22/2013 1632   LABBARB NONE DETECTED 09/22/2013 1632    Alcohol Level:   Recent Labs Lab 09/22/13 1600  ETH <11    CT of the brain   09/23/2013 Marked progression of right hemisphere infarction now involving most of the right middle cerebral artery as well as nearly all of the right ACA territory. Developing right-to-left shift of 5 mm.   09/22/2013   Postcontrast enhancement and/or microhemorrhage affecting the right hemisphere cortex, white matter, and basal ganglia structures subserved by the right MCA vascular territory. Gross patency of the right anterior circulation has been re-established.  No significant mass effect or midline shift. Continued surveillance is warranted.  09/22/2013    Slightly motion degraded exam.  No intracranial hemorrhage.  Dense appearance of the M1 segment of the right middle cerebral artery. This may indicate thrombus or be normal (only minimally different than the left side).  Small vessel disease type changes.  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    Cerebral Angio 09/22/2013 complete TICI3 revascularization of occluded RT MCA using 13..4 mg of IA TPA and x1 pass with the SOLITAIRE FR 4mm x 40 mm Retrieval device.   CT angio head and neck 1. Progressed right hemisphere cytotoxic edema with confluent right MCA, and right ACA, territory involvement.  2. No hemorrhagic transformation. Increased leftward midline shift to 13 mm (previously 6 mm). 3. Right anterior circulation is patent without focal irregularity, stenosis, or major branch occlusion identified. There is mass effect on the right MCA and ACA vessels.  4. Neck CTA with bilateral carotid plaque and tortuosity but no hemodynamically significant carotid stenosis. 5. 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 6. Emphysema.   MRI of the brain    2D Echocardiogram    CXR   09/14/2013 No focal chest disease. Support apparatuses. 09/23/2013    1. Endotracheal tube in stable position. 2. No acute cardiopulmonary disease. COPD cannot be excluded. Stable elevation left hemidiaphragm.    09/23/2013    1. Endotracheal tube tip in satisfactory position projecting approximately 5 cm above the carina. 2. Hyperinflation consistent with COPD and/or asthma.  Elevation of the left hemidiaphragm. No acute cardiopulmonary disease.     EKG  atrial fibrillation, rate 82.   Therapy Recommendations   Physical Exam   Frail elderly caucasian male in mild respiratory distress with oral secretions . Afebrile.  Neck is supple   . Cardiac exam no murmur or gallop. Lungs reveal bilateral coarse crackles to auscultation. Distal pulses are well felt. Neurological Exam : Unresponsive. No  gaze deviation and can follow gaze in  Horizontal direction. Pupils irregular but equal and reactive. Fundi were not visualized. Left lower facial weakness. Tongue is midline. Does not move extremities to sternal rub or Left plantar is upgoing and right is downgoing. Tone is diminished on the left. Gait was not tested.   ASSESSMENT Randy Taylor is a 73 y.o. male presenting with acute onset slurred speech and left hemiplegia. Status post IV t-PA 09/22/2013 at 1623. There was complete TICI3 revascularization of occluded RT MCA using IA TPA and SOLITAIRE x 1 pass, RT ACA occlusion also revascularized. 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 Patient with cytotoxic cerebral edema and midline shift that has increased from 5mm to 13mm. Infarct felt to be embolic secondary to new onset atrial fibrillation.  On aspirin, dose unclear prior to admission. Now on aspirin 300 mg suppository for secondary stroke prevention. Patient with neuro worsening over night; stroke larger than expected, most likely due to new ICA occlusion. Patient with resultant VDRF (intubated for intervention), dysphagia, lethargy, less consistent with being able to follow commands, withdraws to pain. Neurologic prognosis poor. Family opted for withdrawal of ventilator and comfort care. Placed on morphine drip. Patient transferred to floor.   Induced hypernatremia with 3% saline protocol in order to decrease cerebral edema, now off.  Atrial  Fibrillation, new onset hypertension Diabetes, HgbA1c 5.9, goal < 7.0 Hyperlipidemia, LDL 104, on no statin PTA, now on no statin, goal LDL < 100 (< 70 for diabetics)   Hospital day # 3  TREATMENT/PLAN  Comfort care  Palliative care team consult for symptom management  Discussion with multiple family members at the bedside  I have personally obtained a history, examined the patient, evaluated imaging results, and formulated the assessment and plan of care. I agree with the above. Delia HeadyPramod Avantae Bither, MD

## 2013-09-25 NOTE — Progress Notes (Signed)
Chaplain met with family at pt bedside after pt was transferred from ICU to comfort care.  4 of the 5 daughters were present and discussed their journey to this stage and their decisions going forward.  The youngest daughter, Nira Conn, is somewhat emotional and well supported by her older siblings.  Family commented about how their family has experienced death of their mother and brother and how this encounter has happened so quickly.   Chaplain offered a time of empathetic listening and sharing of emotional and spiritual concerns.  Chaplain also led family in a time of prayer for peace and offered grief support.  Chaplain is available for further support if desired.   09/25/13 1000  Clinical Encounter Type  Visited With Family;Patient not available;Health care provider  Visit Type Initial;Spiritual support;Patient actively dying  Referral From Nurse  Spiritual Encounters  Spiritual Needs Prayer;Emotional;Grief support  Stress Factors  Patient Stress Factors None identified  Family Stress Factors Family relationships;Major life changes   [place]

## 2013-09-25 NOTE — Progress Notes (Signed)
[facility] P[facility]  Name: Randy Taylor MRN: [id number] DOB: [birth year]    ADMISSION DATE:  09/22/2013 CONSULTATION DATE:  09/22/13  REFERRING MD :  Dr. Roseanne Reno PRIMARY SERVICE:  Neurology  CHIEF COMPLAINT:  Slurred speech, left-sided weakness  BRIEF PATIENT DESCRIPTION: The patient is a 73 yo man, history of HTN, DM, presenting 1/13 with slurred speech, left-sided weakness, and left-sided neglect, found to have R MCA occlusion by CT and angiogram, given TPA and taken to IR for TPA and clot retrieval.  SIGNIFICANT EVENTS / STUDIES:  1/13 ct head>>>m1 segment infarct 1/13 - admitted for acute R MCA stroke, s/p TPA and IR clot retrieval 1/14 ct head>>Postcontrast enhancement and/or microhemorrhage affecting the right hemisphere cortex, white matter, and basal ganglia structuressubserved by the right MCA vascular territory; R > L midline shift 5mm 1/14 hypertonic saline started 1/15 CT angio brain >> 1/16 full comfort care  LINES / TUBES: ETT 1/13 >>out Foley 1/13>> R IJ CVL 1/14 >>  CULTURES: None  ANTIBIOTICS: None  SUBJECTIVE: full comfort care VITAL SIGNS: Temp:  [98.1 F (36.7 C)-100.8 F (38.2 C)] 100.8 F (38.2 C) (01/16 0845) Pulse Rate:  [90-107] 92 (01/16 0845) Resp:  [7-20] 12 (01/16 0845) BP: (109-144)/(60-87) 109/69 mmHg (01/16 0845) SpO2:  [95 %-100 %] 95 % (01/16 0845) FiO2 (%):  [40 %] 40 % (01/15 1500) Weight:  [144 lb 6.1 oz (65.49 kg)] 144 lb 6.1 oz (65.49 kg) (01/15 1500) HEMODYNAMICS:   VENTILATOR SETTINGS: Vent Mode:  [-] PRVC FiO2 (%):  [40 %] 40 % Set Rate:  [14 bmp] 14 bmp Vt Set:  [560 mL] 560 mL PEEP:  [5 cmH20] 5 cmH20 Plateau Pressure:  [11 cmH20] 11 cmH20 INTAKE / OUTPUT: Intake/Output     01/15 0701 - 01/16 0700 01/16 0701 - 01/17 0700   I.V. (mL/kg) 600 (9.2)    IV Piggyback     Total Intake(mL/kg) 600 (9.2)    Urine (mL/kg/hr) 1300 (0.8)    Total Output 1300     Net -700            PHYSICAL EXAMINATION: General: sedated HEENT:  no lan PULM: rattling resp CV: Irreg irreg, no mgr AB: BS+, soft, nontender Ext: warm, no edema Neuro: sedated    LABS:  CBC  Recent Labs Lab 09/22/13 1600 09/22/13 1611 09/23/13 0500 09/24/13 0430  WBC 9.7  --  14.1* 14.1*  HGB 16.6 17.7* 14.2 12.8*  HCT 46.8 52.0 40.5 37.8*  PLT 259  --  238 183   Coag's  Recent Labs Lab 09/22/13 1600  APTT 29  INR 0.98   BMET  Recent Labs Lab 09/22/13 1600 09/22/13 1611 09/23/13 0500  09/24/13 0430 09/24/13 0630 09/24/13 1350  NA 133* 135* 134*  < > 147 147 151*  K 4.4 4.3 4.9  --  4.1  --   --   CL 95* 98 101  --  113*  --   --   CO2 28  --  18*  --  20  --   --   BUN 11 11 16   --  21  --   --   CREATININE 1.03 1.20 1.13  --  1.12  --   --   GLUCOSE 111* 112* 165*  --  128*  --   --   < > = values in this interval not displayed. Electrolytes  Recent Labs Lab 09/22/13 1600 09/23/13 0500 09/24/13 0430  CALCIUM 9.5 8.9 8.8  MG  --  1.8  --   PHOS  --  3.8  --    Sepsis Markers No results found for this basename: LATICACIDVEN, PROCALCITON, O2SATVEN,  in the last 168 hours ABG  Recent Labs Lab 09/22/13 2130 09/23/13 0500  PHART 7.376 7.343*  PCO2ART 35.7 35.9  PO2ART 87.6 146.0*   Liver Enzymes  Recent Labs Lab 09/22/13 1600 09/24/13 0430  AST 18 20  ALT 9 10  ALKPHOS 82 64  BILITOT 0.4 0.5  ALBUMIN 3.8 2.7*   Cardiac Enzymes  Recent Labs Lab 09/22/13 1600  TROPONINI <0.30   Glucose  Recent Labs Lab 09/23/13 1558 09/23/13 1943 09/24/13 0005 09/24/13 0414 09/24/13 0819 09/24/13 1156  GLUCAP 119* 129* 138* 113* 125* 125*    Imaging   CXR: improved tax left, ett wnl, no infiltrate  ASSESSMENT / PLAN:  PULMONARY  A: Acute resp failure due to inability to protect airway P:  Full comfort care PCCM will sign off.   [name] ACNP [name] P[facility] Pager [phone] till 3 pm If no answer page [phone] 09/25/2013, 9:28 AM    Staff note  Patien now full comfort. Will sign  off  Dr. Kalman ShanMurali Vestal Markin, M.D., [facility] SurgeryF.C.C.P Pulmonary and Critical Care Medicine Staff Physician [facility] Pager: [phone], If no answer or between  15:00h - 7:00h: call [phone]  09/29/2013 11:02 AM

## 2013-09-25 NOTE — Progress Notes (Signed)
Nutrition Brief Note  Chart reviewed. Pt now transitioning to comfort care.  No further nutrition interventions warranted at this time.  Please re-consult as needed.   Oaklie Durrett MS, RD, LDN Pager: [phone] After-hours pager: [phone]    

## 2013-09-25 NOTE — Care Management Note (Signed)
    Page 1 of 1   09/25/2013     4:22:54 PM   CARE MANAGEMENT NOTE 09/25/2013  Patient:  [name],Ibrahim S   Account Number:  [id number]  Date Initiated:  09/25/2013  Documentation initiated by:  Ronny FlurryWILE,Brittan Butterbaugh  Subjective/Objective Assessment:     Action/Plan:   Anticipated DC Date:     Anticipated DC Plan:           Choice offered to / List presented to:             Status of service:   Medicare Important Message given?   (If response is "NO", the following Medicare IM given date fields will be blank) Date Medicare IM given:   Date Additional Medicare IM given:    Discharge Disposition:    Per UR Regulation:    If discussed at Long Length of Stay Meetings, dates discussed:    Comments:  09-25-13 At 1530 Yong ChannelAlicia Parker NP notified case management , attending wanted "GIP " . Called Annie MainSharon Biby NP , she will have Dr [name] call hospice MD .  Explained GIP to patient family , offered choice .  They had no preference.  Spoke to Lehman BrothersEva Davis at [place]30 ,instructed to call [place]Margie . [facility] to [facility] patient to [facility] until Monday.   Spoke to DwaleDarcy [facility] , unable to admit today , possibility tomorrow . Faxed information to [phone] .  Annie MainSharon Biby NP aware . Case Manager to contact Neuro PA [name] ( pager number will be on amion)  tomorrow , regarding GIP .  Family aware and understands  Ronny FlurryHeather Herrick Hartog RN BSN [phone]

## 2013-09-26 ENCOUNTER — Encounter (HOSPITAL_COMMUNITY): Payer: Self-pay | Admitting: Internal Medicine

## 2013-09-26 ENCOUNTER — Inpatient Hospital Stay (HOSPITAL_COMMUNITY)
Admission: RE | Admit: 2013-09-26 | Discharge: 2013-10-11 | DRG: 064 | Disposition: E | Source: Hospice | Attending: Internal Medicine | Admitting: Internal Medicine

## 2013-09-26 DIAGNOSIS — I6350 Cerebral infarction due to unspecified occlusion or stenosis of unspecified cerebral artery: Secondary | ICD-10-CM

## 2013-09-26 DIAGNOSIS — Z66 Do not resuscitate: Secondary | ICD-10-CM | POA: Diagnosis not present

## 2013-09-26 DIAGNOSIS — I10 Essential (primary) hypertension: Secondary | ICD-10-CM | POA: Diagnosis present

## 2013-09-26 DIAGNOSIS — E119 Type 2 diabetes mellitus without complications: Secondary | ICD-10-CM | POA: Diagnosis present

## 2013-09-26 DIAGNOSIS — J9600 Acute respiratory failure, unspecified whether with hypoxia or hypercapnia: Secondary | ICD-10-CM | POA: Diagnosis present

## 2013-09-26 DIAGNOSIS — Z515 Encounter for palliative care: Secondary | ICD-10-CM

## 2013-09-26 DIAGNOSIS — R4182 Altered mental status, unspecified: Secondary | ICD-10-CM | POA: Diagnosis present

## 2013-09-26 DIAGNOSIS — I639 Cerebral infarction, unspecified: Secondary | ICD-10-CM | POA: Diagnosis present

## 2013-09-26 DIAGNOSIS — G936 Cerebral edema: Secondary | ICD-10-CM | POA: Diagnosis present

## 2013-09-26 MED ORDER — GLYCOPYRROLATE 0.2 MG/ML IJ SOLN
0.1000 mg | INTRAMUSCULAR | Status: DC
  Administered 2013-09-26 – 2013-09-27 (×5): 0.1 mg via INTRAVENOUS
  Filled 2013-09-26 (×10): qty 0.5

## 2013-09-26 MED ORDER — ACETAMINOPHEN 325 MG PO TABS
650.0000 mg | ORAL_TABLET | Freq: Four times a day (QID) | ORAL | Status: DC | PRN
Start: 2013-09-26 — End: 2013-09-27

## 2013-09-26 MED ORDER — LORAZEPAM 2 MG/ML IJ SOLN
1.0000 mg | Freq: Two times a day (BID) | INTRAMUSCULAR | Status: DC
  Administered 2013-09-26 – 2013-09-27 (×2): 1 mg via INTRAVENOUS
  Filled 2013-09-26 (×2): qty 1

## 2013-09-26 MED ORDER — ATROPINE SULFATE 1 % OP SOLN: 4.0000 [drp] | OPHTHALMIC | Status: DC | PRN

## 2013-09-26 MED ORDER — HYDROMORPHONE BOLUS VIA INFUSION
2.0000 mg | INTRAVENOUS | Status: DC | PRN
  Filled 2013-09-26: qty 2

## 2013-09-26 MED ORDER — GLYCOPYRROLATE 0.2 MG/ML IJ SOLN
0.1000 mg | INTRAMUSCULAR | Status: DC
  Administered 2013-09-26 (×2): 0.1 mg via INTRAVENOUS
  Filled 2013-09-26 (×7): qty 0.5

## 2013-09-26 MED ORDER — ALBUTEROL SULFATE (2.5 MG/3ML) 0.083% IN NEBU: 2.5000 mg | INHALATION_SOLUTION | RESPIRATORY_TRACT | Status: DC | PRN

## 2013-09-26 MED ORDER — POLYVINYL ALCOHOL 1.4 % OP SOLN
1.0000 [drp] | Freq: Four times a day (QID) | OPHTHALMIC | Status: DC | PRN
  Filled 2013-09-26: qty 15

## 2013-09-26 MED ORDER — WHITE PETROLATUM GEL
Status: AC
  Administered 2013-09-26: 0.2
  Filled 2013-09-26: qty 5

## 2013-09-26 MED ORDER — ACETAMINOPHEN 650 MG RE SUPP
650.0000 mg | Freq: Four times a day (QID) | RECTAL | Status: DC | PRN
  Administered 2013-09-27: 650 mg via RECTAL
  Filled 2013-09-26 (×3): qty 1

## 2013-09-26 MED ORDER — ATROPINE SULFATE 1 % OP SOLN
4.0000 [drp] | OPHTHALMIC | Status: DC | PRN
  Administered 2013-09-27 (×2): 4 [drp] via SUBLINGUAL

## 2013-09-26 MED ORDER — HYDROMORPHONE HCL PF 10 MG/ML IJ SOLN
4.0000 mg/h | INTRAMUSCULAR | Status: DC
  Administered 2013-09-26: 4 mg/h via INTRAVENOUS
  Filled 2013-09-26: qty 10

## 2013-09-26 MED ORDER — GLYCOPYRROLATE 0.2 MG/ML IJ SOLN: 0.1000 mg | INTRAMUSCULAR | Status: DC

## 2013-09-26 MED ORDER — LORAZEPAM 2 MG/ML IJ SOLN: 1.0000 mg | Freq: Two times a day (BID) | INTRAMUSCULAR | Status: DC

## 2013-09-26 MED ORDER — HYDROMORPHONE BOLUS VIA INFUSION: 2.0000 mg | INTRAVENOUS | Status: DC | PRN

## 2013-09-26 MED ORDER — SODIUM CHLORIDE 0.9 % IV SOLN
4.0000 mg/h | INTRAVENOUS | Status: DC
  Administered 2013-09-27 (×2): 4 mg/h via INTRAVENOUS
  Filled 2013-09-26 (×2): qty 10

## 2013-09-26 MED ORDER — SODIUM CHLORIDE 0.9 % IV SOLN: INTRAVENOUS | Status: DC

## 2013-09-26 MED ORDER — SCOPOLAMINE 1 MG/3DAYS TD PT72
1.0000 | MEDICATED_PATCH | TRANSDERMAL | Status: DC
  Administered 2013-09-27: 1.5 mg via TRANSDERMAL
  Filled 2013-09-26: qty 1

## 2013-09-26 MED ORDER — SODIUM CHLORIDE 0.9 % IV SOLN: 4.0000 mg/h | INTRAVENOUS | Status: DC

## 2013-09-26 MED ORDER — SCOPOLAMINE 1 MG/3DAYS TD PT72: 1.0000 | MEDICATED_PATCH | TRANSDERMAL | Status: DC

## 2013-09-26 NOTE — Progress Notes (Signed)
[facility] admission note:  Met with Family, 5 daughters in the rm. Leighton Roach agreed to sign consent for the pt. Discussion with the family of goals of care. All in agreement that they want to focus strictly on keeping the pt comfortable. They all agree this would be his (the pt's) wishes as well. Discussion was held that if the pt transitions to becoming stable we would look at discharging him to our [facility] in [facility]. They are aware that the pt's prognosis is short and unfortunately grim in nature. They are in agreement with this as well. The family was explained criteria to meet GIP status. All daughters are in agreement with comfort care only and proceeding with GIP care and Hospice services.   The pt appears to be comfortable. He is unresponsive. Pupils are pinpoint. The pt has noticeable terminal congestion heard and there has already been changes made to transition from atropine to robinol as well. The family is in agreement with this. The pt has a foley catheter in place and it is draining a dark colored urine. Explained to family that this would be normal as the pt becomes more dehydrated from not eating or drinking. The pt has irregular rate of respirations changing from being rapid to shallow. Education was given regarding this and explained why this is happening. The pt was on a morphine drip yesterday and was not comfortable. To help with his agitation and discomfort the pt was then changed to a dilaudid drip at 37m per hour continuously. He is also getting ativan BID. The family feels he is more comfortable with the new medication in place. He has rhochi heard on inspiratory and expiratory lung fields. There is very little air movement heard on ascultation. The family was prepared for impending death. They were educated on changes that they could see as we transition through the next 24-48 hours. Examples included increased agitation, increased terminal secretions,  mottling etc.   The family is grieving appropriately. They are tearful but all appear and verbalize being a "good place as long as he is comfortable". The pt is a DNR and the family verbalize that this would be his wishes and there wishes for the pt.   SGeri Seminole Case manager for GIP admissions was notified of the pt and the families wishes. I spoke to our Medical Director with [place] she is also in agreement with the plan of care and feels he meets GIP criteria. I have spoke to the nurse CArbie Cookeyon the floor today and she is in agreement with the plan of care. She verbalizes speaking to DNelida Meusethe PA for the attending MD with Neurology. SAntony Contras He will be up in 2 hours to discharge the pt. CArbie Cookeythe RN will be notifying the admitting department to let them know that [facility] once the PA has discharged/re-admit the pt. I have spoke to Dr. GHilma Favorsand she has already been following the pt during this hospitalization and is in agreement of being attending physician for hospice GIP services to proceed. The RN carol will notify her once she can do admission orders.  My phone number is 9Imperial BeachRN BSN with CCommercial Metals CompanyNurse Liason with [facility]  Copies of the consent are left on the shadow chart for review if needed. CWebb SilversmithRN

## 2013-09-26 NOTE — Progress Notes (Signed)
   CARE MANAGEMENT NOTE 09/18/2013  Patient:  Randy Taylor,Randy Taylor   Account Number:  [id number]  Date Initiated:  09/25/2013  Documentation initiated by:  Ronny FlurryWILE,Randy  Subjective/Objective Assessment:     Action/Plan:   Anticipated DC Date:     Anticipated DC Plan:  HOME W HOME HEALTH SERVICES      DC Planning Services  CM consult      Choice offered to / List presented to:             Status of service:  Completed, signed off Medicare Important Message given?   (If response is "NO", the following Medicare IM given date fields will be blank) Date Medicare IM given:   Date Additional Medicare IM given:    Discharge Disposition:    Per UR Regulation:    If discussed at Long Length of Stay Meetings, dates discussed:    Comments:  10/09/2013 09:05 CM spoke with family in room to let them know Randy Parcelheri [facility] would be arriving within the next two hours.  The family was appreciative. Randy Champagneave Rinehuls, PA was notified and will complete the Discharge Summary.  Randy OdorGolding, MD will be attending for GIP. No other CM needs were communicated, Randy Taylor, BSN, KentuckyCM [phone]  .  09-25-13 At 1530 Randy ChannelAlicia Parker NP notified case management , attending wanted "GIP " . Called Randy MainSharon Biby NP , she will have Dr Randy BrownieSethi call hospice MD .  Explained GIP to patient family , offered choice .  They had no preference.  Spoke to Randy BrothersEva Taylor at [place]1530 ,instructed to call AlbaMargie . [facility] unable to [facility] patient to [facility] until Monday.   Spoke to BoothvilleDarcy at [facility] , unable to admit today , possibility tomorrow . Faxed information to [phone] .  Randy MainSharon Biby NP aware . Case Manager to contact Neuro PA Randy Taylor ( pager number will be on amion)  tomorrow , regarding GIP .  Family aware and understands  Ronny FlurryHeather Wile RN BSN [phone]

## 2013-09-26 NOTE — Discharge Summary (Signed)
Stroke Discharge Summary  Patient ID: hassel uphoff   MRN: [id number]      DOB: [birth year]  Date of Admission: 09/22/2013 Date of Discharge: 09/22/2013  Attending Physician:  Darcella Cheshire, MD, Stroke MD  Consulting Physician(s):   Treatment Team:  Palliative [facility]  Patient's PCP:  No primary provider on file.  Discharge Diagnoses:  Active Problems:   CVA (cerebral infarction)   Diabetes mellitus   Hypertension   Acute respiratory failure   HTN (hypertension)   Altered mental status   Palliative care patient   DNR (do not resuscitate)  BMI: Body mass index is 21.31 kg/(m^2).  Past Medical History  Diagnosis Date  . Diabetes mellitus without complication   . Hypertension    Past Surgical History  Procedure Laterality Date  . Radiology with anesthesia N/A 09/22/2013    Procedure: RADIOLOGY WITH ANESTHESIA;  Surgeon: Oneal Grout, MD;  Location: [facility] OR;  Service: Radiology;  Laterality: N/A;      Medication List    ASK your doctor about these medications       albuterol 108 (90 BASE) MCG/ACT inhaler  Commonly known as:  PROVENTIL HFA;VENTOLIN HFA  Inhale 2 puffs into the lungs every 6 (six) hours as needed for wheezing or shortness of breath.     amLODipine-benazepril 10-20 MG per capsule  Commonly known as:  LOTREL  Take 1 capsule by mouth daily.     Fluticasone-Salmeterol 250-50 MCG/DOSE Aepb  Commonly known as:  ADVAIR  Inhale 1 puff into the lungs 2 (two) times daily.     metFORMIN 500 MG tablet  Commonly known as:  GLUCOPHAGE  Take 500 mg by mouth daily with breakfast.     metoprolol succinate 25 MG 24 hr tablet  Commonly known as:  TOPROL-XL  Take 25 mg by mouth daily.     omeprazole 20 MG capsule  Commonly known as:  PRILOSEC  Take 20 mg by mouth daily.     pregabalin 50 MG capsule  Commonly known as:  LYRICA  Take 50 mg by mouth 3 (three) times daily.        LABORATORY STUDIES CBC    Component Value  Date/Time   WBC 14.1* 09/24/2013 0430   RBC 4.23 09/24/2013 0430   HGB 12.8* 09/24/2013 0430   HCT 37.8* 09/24/2013 0430   PLT 183 09/24/2013 0430   MCV 89.4 09/24/2013 0430   MCH 30.3 09/24/2013 0430   MCHC 33.9 09/24/2013 0430   RDW 15.7* 09/24/2013 0430   LYMPHSABS 1.1 09/24/2013 0430   MONOABS 1.9* 09/24/2013 0430   EOSABS 0.0 09/24/2013 0430   BASOSABS 0.0 09/24/2013 0430   CMP    Component Value Date/Time   NA 151* 09/24/2013 1350   K 4.1 09/24/2013 0430   CL 113* 09/24/2013 0430   CO2 20 09/24/2013 0430   GLUCOSE 128* 09/24/2013 0430   BUN 21 09/24/2013 0430   CREATININE 1.12 09/24/2013 0430   CALCIUM 8.8 09/24/2013 0430   PROT 5.3* 09/24/2013 0430   ALBUMIN 2.7* 09/24/2013 0430   AST 20 09/24/2013 0430   ALT 10 09/24/2013 0430   ALKPHOS 64 09/24/2013 0430   BILITOT 0.5 09/24/2013 0430   GFRNONAA 63* 09/24/2013 0430   GFRAA 73* 09/24/2013 0430   COAGS Lab Results  Component Value Date   INR 0.98 09/22/2013   Lipid Panel    Component Value Date/Time   CHOL 158 09/23/2013 0500   TRIG 70 09/23/2013  0500   HDL 40 09/23/2013 0500   CHOLHDL 4.0 09/23/2013 0500   VLDL 14 09/23/2013 0500   LDLCALC 104* 09/23/2013 0500   HgbA1C  Lab Results  Component Value Date   HGBA1C 5.9* 09/23/2013   Cardiac Panel (last 3 results) No results found for this basename: CKTOTAL, CKMB, TROPONINI, RELINDX,  in the last 72 hours Urinalysis    Component Value Date/Time   COLORURINE YELLOW 09/22/2013 1632   APPEARANCEUR CLOUDY* 09/22/2013 1632   LABSPEC 1.015 09/22/2013 1632   PHURINE 6.5 09/22/2013 1632   GLUCOSEU NEGATIVE 09/22/2013 1632   HGBUR NEGATIVE 09/22/2013 1632   BILIRUBINUR NEGATIVE 09/22/2013 1632   KETONESUR NEGATIVE 09/22/2013 1632   PROTEINUR 100* 09/22/2013 1632   UROBILINOGEN 1.0 09/22/2013 1632   NITRITE NEGATIVE 09/22/2013 1632   LEUKOCYTESUR NEGATIVE 09/22/2013 1632   Urine Drug Screen     Component Value Date/Time   LABOPIA NONE DETECTED 09/22/2013 1632   COCAINSCRNUR NONE DETECTED 09/22/2013  1632   LABBENZ NONE DETECTED 09/22/2013 1632   AMPHETMU NONE DETECTED 09/22/2013 1632   THCU NONE DETECTED 09/22/2013 1632   LABBARB NONE DETECTED 09/22/2013 1632    Alcohol Level    Component Value Date/Time   ETH <11 09/22/2013 1600     SIGNIFICANT DIAGNOSTIC STUDIES CT Head 09/22/2013 Slightly motion degraded exam.  No intracranial hemorrhage.  Dense appearance of the M1 segment of the right middle cerebral  artery. This may indicate thrombus or be normal (only minimally  different than the left side).  Small vessel disease type changes.  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  CT head 09/22/2013 Postcontrast enhancement and/or microhemorrhage affecting the right  hemisphere cortex, white matter, and basal ganglia structures  subserved by the right MCA vascular territory. Gross patency of the  right anterior circulation has been re-established.  No significant mass effect or midline shift. Continued surveillance  is warranted.  CT head 09/23/2013 Marked progression of right hemisphere infarction now involving most  of the right middle cerebral artery as well as nearly all of the  right ACA territory. Developing right-to-left shift of 5 mm  CT ANGIOGRAPHY HEAD AND NECK 09/24/2013 1. Progressed right hemisphere cytotoxic edema with confluent right  MCA, and right ACA, territory involvement.  2. No hemorrhagic transformation. Increased leftward midline shift  to 13 mm (previously 6 mm).  3. Right anterior circulation is patent without focal irregularity,  stenosis, or major branch occlusion identified. There is mass effect  on the right MCA and ACA vessels.  4. Neck CTA with bilateral carotid plaque and tortuosity but no  hemodynamically significant carotid stenosis.  5. 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  6. Emphysema.        History of Present  Illness    Randy Taylor is a 73 y.o. male with a history of hypertension and diabetes mellitus who experienced acute onset of slurred speech and left hemiplegia at 2:45 PM on 09/22/2013. No previous history of stroke nor TIA. Patient had been taking aspirin daily. CT scan of his head showed what appeared to be increased density involving the right proximal middle cerebral artery. 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 NIH stroke score was 19. Patient was deemed a candidate for intravenous TPA which was administered. Family consented for revascularization procedures in IR.  Hospital Course  Following admission for a right CVA a decision  was made to treat the patient with intravenous TPA. Subsequently the family consented for a revascularization procedure in interventional radiology. S/P rt common carotid and Rt vert artery arteriograms,followed by complete TICI3 revascularization of occluded RT MCA using 13..4 mg of IA TPA and x1 pass with the SOLITAIRE FR 4mm x 40 mm Retrieval device performed by Dr.Deveshwar.  Following intervention the patient was admitted to the neurointensive care unit. A consult was obtained from the critical care team to assist with the ventilator as well as management of other medical issues. The patient's initial ECG revealed atrial fibrillation.  Apparently this was a new diagnosis. The patient was felt to be critically ill and this was discussed with the patient's family. Aspirin therapy was initiated after 24 hours per TPA protocol.  A CT scan of the head on January 14 showed a progression of the right hemispheric infarct involving the MCA and ACA distributions. Dr. Thad Ranger discussed these findings with the patient's 5 daughters. A decision was made to start hypertonic saline.  On 09/24/2013 after discussions regarding the prognosis the family opted for comfort care measures only. The patient was placed on a morphine drip and was  extubated. Arrangements were made to transfer the patient to Dr. Anderson Malta for hospice care.  Patient with vascular risk factors of:   Hyperlipidemia  Diabetes mellitus  Hypertension  Atrial fibrillation   Patient also with:  Hyponatremia  Emphysema   Physical therapy, occupational therapy and speech therapy were asked to evaluate the patient; however, because of the severe medical issues there were not able to finish her evaluations.  Discharge Exam  Blood pressure 100/60, pulse 110, temperature 99.8 F (37.7 C), temperature source Axillary, resp. rate 16, height 5\' 9"  (1.753 m), weight 65.49 kg (144 lb 6.1 oz), SpO2 85.00%.  Frail elderly caucasian male in mild respiratory distress with oral secretions . Afebrile. Neck is supple . Cardiac exam no murmur or gallop. Lungs reveal bilateral coarse crackles to auscultation. Distal pulses are well felt.  Neurological Exam : Unresponsive. No gaze deviation and can follow gaze in Horizontal direction. Pupils irregular but equal and reactive. Fundi were not visualized. Left lower facial weakness. Tongue is midline. Does not move extremities to sternal rub or Left plantar is upgoing and right is downgoing. Tone is diminished on the left. Gait was not tested.   Discharge Diet   NPO no liquids  Discharge Plan    Disposition:  Hospice care per Dr. Anderson Malta.  Comfort care measures only  All medications discontinued except for those needed for the patient's comfort.   Greater than 30  minutes were spent preparing discharge.  Delton See PA-C [facility] Pager [phone] 10/01/2013, 2:48 PM  I have personally examined this patient, reviewed pertinent data and developed the plan of care. I agree with above.

## 2013-09-26 NOTE — Progress Notes (Signed)
DC'd Morphine drip.Wasted 10 cc Morphine in sink. Malen Gauzearol Pamela Maddy, RN.witnessed by Yvone NeuBrenda Hall, RN

## 2013-09-26 NOTE — Progress Notes (Signed)
Stroke Team Progress Note  HISTORY Randy Taylor is an 73 y.o. male with a history of hypertension and diabetes mellitus who experienced acute onset of slurred speech and left hemiplegia at 2:45 PM today 1/113/2015. No previous history of stroke nor TIA. Patient has been taking aspirin daily. CT scan of his head appeared to be increased density involving the right proximal middle cerebral artery. 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 NIH stroke score was 19. Patient was deemed a candidate for intravenous TPA which was administered. Family consented for revascularization procedures in IR. Dr. Corliss Skainseveshwar obtained complete TICI3 revascularization of occluded RT MCA using 13.4 mg of IA TPA and x1 pass with the SOLITAIRE FR 4mm x 40 mm Retrieval device, RT ACA occlusion also revascularized.  He was admitted to the neuro ICU for further evaluation and treatment.  SUBJECTIVE Patient made comfort care yesterday by family. He is on morphine drip. There are multiple family members at the bedside  OBJECTIVE Most recent Vital Signs: Filed Vitals:   09/25/13 0349 09/25/13 0845 08-15-2014 0100 08-15-2014 0512  BP: 125/60 109/69 100/45 100/60  Pulse: 107 92 96 110  Temp:  100.8 F (38.2 C)  99.8 F (37.7 C)  TempSrc:  Axillary  Axillary  Resp: 7 12  16   Height:      Weight:      SpO2: 98% 95%  85%   CBG (last 3)   Recent Labs  09/24/13 0414 09/24/13 0819 09/24/13 1156  GLUCAP 113* 125* 125*    IV Fluid Intake:   . HYDROmorphone 4 mg/hr (08-15-2014 1124)    MEDICATIONS  . glycopyrrolate  0.1 mg Intravenous Q4H  . LORazepam  1 mg Intravenous Q12H  . scopolamine  1 patch Transdermal Q72H   PRN:  atropine, HYDROmorphone, LORazepam  Diet:  NPO  Activity:  Bedrest DVT Prophylaxis:  SCDs   CLINICALLY SIGNIFICANT STUDIES Basic Metabolic Panel:   Recent Labs Lab 09/22/13 1611 09/23/13 0500  09/24/13 0430 09/24/13 0630 09/24/13 1350  NA 135* 134*  < > 147  147 151*  K 4.3 4.9  --  4.1  --   --   CL 98 101  --  113*  --   --   CO2  --  18*  --  20  --   --   GLUCOSE 112* 165*  --  128*  --   --   BUN 11 16  --  21  --   --   CREATININE 1.20 1.13  --  1.12  --   --   CALCIUM  --  8.9  --  8.8  --   --   MG  --  1.8  --   --   --   --   PHOS  --  3.8  --   --   --   --   < > = values in this interval not displayed. Liver Function Tests:   Recent Labs Lab 09/22/13 1600 09/24/13 0430  AST 18 20  ALT 9 10  ALKPHOS 82 64  BILITOT 0.4 0.5  PROT 7.1 5.3*  ALBUMIN 3.8 2.7*   CBC:   Recent Labs Lab 09/23/13 0500 09/24/13 0430  WBC 14.1* 14.1*  NEUTROABS 12.5* 11.1*  HGB 14.2 12.8*  HCT 40.5 37.8*  MCV 88.2 89.4  PLT 238 183   Coagulation:   Recent Labs Lab 09/22/13 1600  LABPROT 12.8  INR 0.98   Cardiac Enzymes:  Recent Labs Lab 09/22/13 1600  TROPONINI <0.30   Urinalysis:   Recent Labs Lab 09/22/13 1632  COLORURINE YELLOW  LABSPEC 1.015  PHURINE 6.5  GLUCOSEU NEGATIVE  HGBUR NEGATIVE  BILIRUBINUR NEGATIVE  KETONESUR NEGATIVE  PROTEINUR 100*  UROBILINOGEN 1.0  NITRITE NEGATIVE  LEUKOCYTESUR NEGATIVE   Lipid Panel    Component Value Date/Time   CHOL 158 09/23/2013 0500   TRIG 70 09/23/2013 0500   HDL 40 09/23/2013 0500   CHOLHDL 4.0 09/23/2013 0500   VLDL 14 09/23/2013 0500   LDLCALC 104* 09/23/2013 0500   HgbA1C  Lab Results  Component Value Date   HGBA1C 5.9* 09/23/2013    Urine Drug Screen:     Component Value Date/Time   LABOPIA NONE DETECTED 09/22/2013 1632   COCAINSCRNUR NONE DETECTED 09/22/2013 1632   LABBENZ NONE DETECTED 09/22/2013 1632   AMPHETMU NONE DETECTED 09/22/2013 1632   THCU NONE DETECTED 09/22/2013 1632   LABBARB NONE DETECTED 09/22/2013 1632    Alcohol Level:   Recent Labs Lab 09/22/13 1600  ETH <11    CT of the brain   09/23/2013 Marked progression of right hemisphere infarction now involving most of the right middle cerebral artery as well as nearly all of the right ACA  territory. Developing right-to-left shift of 5 mm.  09/22/2013   Postcontrast enhancement and/or microhemorrhage affecting the right hemisphere cortex, white matter, and basal ganglia structures subserved by the right MCA vascular territory. Gross patency of the right anterior circulation has been re-established.  No significant mass effect or midline shift. Continued surveillance is warranted.  09/22/2013    Slightly motion degraded exam.  No intracranial hemorrhage.  Dense appearance of the M1 segment of the right middle cerebral artery. This may indicate thrombus or be normal (only minimally different than the left side).  Small vessel disease type changes.  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    Cerebral Angio 09/22/2013 complete TICI3 revascularization of occluded RT MCA using 13..4 mg of IA TPA and x1 pass with the SOLITAIRE FR 4mm x 40 mm Retrieval device.   CT angio head and neck 1. Progressed right hemisphere cytotoxic edema with confluent right MCA, and right ACA, territory involvement.  2. No hemorrhagic transformation. Increased leftward midline shift to 13 mm (previously 6 mm). 3. Right anterior circulation is patent without focal irregularity, stenosis, or major branch occlusion identified. There is mass effect on the right MCA and ACA vessels.  4. Neck CTA with bilateral carotid plaque and tortuosity but no hemodynamically significant carotid stenosis. 5. 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 6. Emphysema.   MRI of the brain    2D Echocardiogram    CXR   09/14/2013 No focal chest disease. Support apparatuses. 09/23/2013    1. Endotracheal tube in stable position. 2. No acute cardiopulmonary disease. COPD cannot be excluded. Stable elevation left hemidiaphragm.    09/23/2013    1. Endotracheal tube tip in satisfactory position projecting approximately 5 cm above the carina. 2.  Hyperinflation consistent with COPD and/or asthma. Elevation of the left hemidiaphragm. No acute cardiopulmonary disease.     EKG  atrial fibrillation, rate 82.   Therapy Recommendations  Physical Exam  General: The patient is comatose at the time of the examination.  Respiratory: Bilateral rhonchi noted.  Cardiovascular: Regular rhythm  Abdomen: Soft  Skin: No significant peripheral edema is noted.   Neurologic Exam  Mental status: The patient is comatose.  Cranial nerves: Eyes are midposition.  Motor: The patient has decreased motor tone all 4's  Coordination: The patient is unable to perform cerebellar testing.  Gait and station: The gait could not be tested.  Reflexes: Deep tendon reflexes are symmetric.   ASSESSMENT Randy Taylor is a 73 y.o. male presenting with acute onset slurred speech and left hemiplegia. Status post IV t-PA 09/22/2013 at 1623. There was complete TICI3 revascularization of occluded RT MCA using IA TPA and SOLITAIRE x 1 pass, RT ACA occlusion also revascularized. 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 Patient with cytotoxic cerebral edema and midline shift that has increased from 5mm to 13mm. Infarct felt to be embolic secondary to new onset atrial fibrillation.  On aspirin, dose unclear prior to admission. Now on aspirin 300 mg suppository for secondary stroke prevention. Patient with neuro worsening over night; stroke larger than expected, most likely due to new ICA occlusion. Patient with resultant VDRF (intubated for intervention), dysphagia, lethargy, less consistent with being able to follow commands, withdraws to pain. Neurologic prognosis poor. Family opted for withdrawal of ventilator and comfort care. Placed on morphine drip. Patient transferred to floor.   Induced hypernatremia with 3% saline protocol in order to decrease cerebral edema, now off.  Atrial  Fibrillation, new onset hypertension Diabetes, HgbA1c 5.9, goal < 7.0 Hyperlipidemia, LDL 104, on no statin PTA, now on no statin, goal LDL < 100 (< 70 for diabetics)   Hospital day # 4  TREATMENT/PLAN  Comfort care  Palliative care team consult for symptom management  Discussion with multiple family members at the bedside  [facility]

## 2013-09-26 NOTE — Progress Notes (Signed)
[facility] Hospice Admission  [facility]  Discharge/Readmit completed. Active order list reviewed in detail. Attending changed to Dr. Anderson MaltaElizabeth Golding, Palliative Medicine Team.  Primary Contact: 1: Page Dr. [name][facility] [phone] 2: Call PMT phone-leave message with specific needs and we will return your call promptly.  Death Certificate will be completed by PMT physician within 24 hours of patient's death.  Anderson MaltaElizabeth Golding, DO Palliative Medicine

## 2013-09-26 NOTE — Consult Note (Addendum)
[facility]  Date: 09/11/2013   Patient Name: Randy PerchesWillard S Taylor  DOB: [birth year]  MRN: [id number]  Age / Sex: 73 y.o., male   PCP: No primary provider on file. 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 Family desire aggressive symptom management and are anticipating a hospital death. PMT consulted for symptom management and to explore hospice options.  Participants in Discussion: Multiple daughters and grandaughters at bedside.  Goals/Summary of Discussion:  1. Code Status:  DNR  2. Scope of Treatment:  Full Comfort  3. Assessment/Plan:  Primary Diagnoses  1. CVA, severe deficits 2. VDRF, now extubated 3. Respiratory Failure, PNA, severe congestion  Prognosis: hours - days  PPS 10%   Active Symptoms 1. Dyspnea 2. Upper Airway secretions 3. Immobility 4. Fever  Recommendations:  Apply Scop patch  Scheduled atropine drops  Will change his morphine infusion to hydromorphone-he is maxing out at 15 and starting to have some myoclonus and early toxicity/side effects.  Ativan for seizure prophylaxis  Reposition for congestion  Robinul added for secretions   4. Palliative Prophylaxis:   PRN Order set  5. Psychosocial Spiritual Asssessment/Interventions:  Patient and Family Adjustment to Illness/Prognosis: Coping very well, they desire information on his condition and progression towards EOL.  Spiritual Concerns or Needs: none expressed  6. Disposition: [facility]. PMT physician happy to assume attending [facility] and patient is D/C-Readmitted under [facility] Care Status-Approval is done by the individual hospice providers not the palliative medicine team providers. Will await notification of [facility] admission-CM has faxed referral  information.  ROS:  Active Medications:  Outpatient medications: Prescriptions prior to admission  Medication Sig Dispense Refill  . albuterol (PROVENTIL HFA;VENTOLIN HFA) 108 (90 BASE) MCG/ACT inhaler Inhale 2 puffs into the lungs every 6 (six) hours as needed for wheezing or shortness of breath.      Marland Kitchen. amLODipine-benazepril (LOTREL) 10-20 MG per capsule Take 1 capsule by mouth daily.      . Fluticasone-Salmeterol (ADVAIR) 250-50 MCG/DOSE AEPB Inhale 1 puff into the lungs 2 (two) times daily.      . metFORMIN (GLUCOPHAGE) 500 MG tablet Take 500 mg by mouth daily with breakfast.      . metoprolol succinate (TOPROL-XL) 25 MG 24 hr tablet Take 25 mg by mouth daily.      Marland Kitchen. omeprazole (PRILOSEC) 20 MG capsule Take 20 mg by mouth daily.      . pregabalin (LYRICA) 50 MG capsule Take 50 mg by mouth 3 (three) times daily.        Current medications: Infusions: . morphine 10 mg/hr (09/28/2013 0445)    Scheduled Medications: . scopolamine  1 patch Transdermal Q72H    PRN Medications: atropine, LORazepam, morphine   Vital Signs: BP 100/60  Pulse 110  Temp(Src) 99.8 F (37.7 C) (Axillary)  Resp 16  Ht 5\' 9"  (1.753 m)  Wt 65.49 kg (144 lb 6.1 oz)  BMI 21.31 kg/m2  SpO2 85%   Physical Exam:  Diaphoretic, congested, unresponsive Labs:  Basic or Comprehensive Metabolic Panel:    Component Value Date/Time   NA 151* 09/24/2013 1350   K 4.1 09/24/2013 0430   CL 113* 09/24/2013 0430   CO2 20 09/24/2013 0430   BUN 21 09/24/2013 0430   CREATININE 1.12 09/24/2013 0430   GLUCOSE  128* 09/24/2013 0430   CALCIUM 8.8 09/24/2013 0430   AST 20 09/24/2013 0430   ALT 10 09/24/2013 0430   ALKPHOS 64 09/24/2013 0430   BILITOT 0.5 09/24/2013 0430   PROT 5.3* 09/24/2013 0430   ALBUMIN 2.7* 09/24/2013 0430     CBC:    Component Value Date/Time   WBC 14.1* 09/24/2013 0430   HGB 12.8* 09/24/2013 0430   HCT 37.8* 09/24/2013 0430   PLT 183 09/24/2013 0430   MCV 89.4 09/24/2013 0430   NEUTROABS 11.1* 09/24/2013  0430   LYMPHSABS 1.1 09/24/2013 0430   MONOABS 1.9* 09/24/2013 0430   EOSABS 0.0 09/24/2013 0430   BASOSABS 0.0 09/24/2013 0430     Imaging:  Ct Angio Head W/cm &/or Wo Cm  09/24/2013   CLINICAL DATA:  73 year old male status post tPA and right MCA embolectomy. Left hemiplegia. Initial encounter.  EXAM: CT ANGIOGRAPHY HEAD AND NECK  TECHNIQUE: Multidetector CT imaging of the head and neck was performed using the standard protocol during bolus administration of intravenous contrast. Multiplanar CT image reconstructions including MIPs were obtained to evaluate the vascular anatomy. Carotid stenosis measurements (when applicable) are obtained utilizing NASCET criteria, using the distal internal carotid diameter as the denominator.  CONTRAST:  50mL OMNIPAQUE IOHEXOL 350 MG/ML SOLN  COMPARISON:  Post treatment head CT 09/23/2013. Cerebral angiogram 09/22/2013.  FINDINGS: CTA HEAD FINDINGS  Progressed and severe hypodensity affecting the right hemisphere in the right MCA territory, but with cingulate and vertex ACA territory involvement as before. Effaced right lateral ventricle. Leftward midline shift has progressed to 13 mm (previously 6 mm). No acute intracranial hemorrhage identified. Stable gray-white matter differentiation in the left hemisphere. Posterior fossa gray-white matter differentiation remains normal in the basilar cisterns remain patent. No abnormal enhancement identified.  VASCULAR FINDINGS:  Dominant distal right vertebral artery is normal. Normal right PICA origin. Non dominant left vertebral artery terminates in PICA. The basilar artery is diminutive, with patent SCA origins and fetal type bilateral PCA origins. Bilateral PCA branches are normal.  Both ICA siphons are patent. Suspect soft and calcified plaque in the left siphon. No hemodynamically significant stenosis. Normal ophthalmic and posterior communicating artery origins. Patent carotid termini.  Normal ACA origins (the right is  dominant) and left MCA origin. The right ACA remains somewhat dominant throughout. Bilateral ACA branches are within normal limits except for mass effect from right hemisphere edema. Left MCA branches are within normal limits.  Right MCA origin is patent. There is asymmetrically increased venous contrast about the right M1 segment. The right M1 segment is patent without stenosis. The right MCA bifurcation is patent. Right MCA branches appear normal except for mass effect from the right cerebral edema. No major branch occlusion identified.  Review of the MIP images confirms the above findings.  CTA NECK FINDINGS  Endotracheal tube tip above the carina. Oral enteric tube in place. Emphysema. No superior mediastinal lymphadenopathy. Negative thyroid, nasopharynx, parapharyngeal spaces, retropharyngeal space, sublingual space, submandibular glands, parotid glands, and orbits soft tissues. No cervical lymphadenopathy. Mild sphenoid sinus opacification. No acute osseous abnormality identified.  VASCULAR FINDINGS: Moderate aortic arch atherosclerosis, calcified and soft plaque. No stenosis at the brachiocephalic artery origin or right CCA origin. Right CCA within normal limits. Calcified plaque at the right carotid bifurcation. No hemodynamically significant stenosis results. Calcified plaque at the anterior right ICA just be on the bulb, no stenosis. Dolichoectatic right ICA, with a kinked appearance just be on this level. Otherwise negative right ICA to the  skullbase.  No proximal right subclavian stenosis. Right vertebral artery origin bordered by plaque but within normal limits. Dominant right vertebral artery, otherwise normal to the skullbase.  Soft and calcified plaque at the left CCA origin resulting in less than 50 % with respect to the distal vessel. Intermittent soft and calcified plaque in the left CCA without stenosis. Calcified plaque at the left carotid bifurcation and in the anterior and medial left ICA  bulb. Subsequent stenosis less than 50 % with respect to the distal vessel. Tortuous mid cervical left ICA with a kinked appearance. Otherwise in normal left ICA to the skullbase.  Non dominant left vertebral artery arises directly from the arch. There is plaque at its origin which appears moderately to severely stenotic (series 16109 image 101). The left vertebral artery is otherwise patent and within normal limits throughout the neck.  Review of the MIP images confirms the above findings.  IMPRESSION: 1. Progressed right hemisphere cytotoxic edema with confluent right MCA, and right ACA, territory involvement. 2. No hemorrhagic transformation. Increased leftward midline shift to 13 mm (previously 6 mm). 3. Right anterior circulation is patent without focal irregularity, stenosis, or major branch occlusion identified. There is mass effect on the right MCA and ACA vessels. 4. Neck CTA with bilateral carotid plaque and tortuosity but no hemodynamically significant carotid stenosis. 5. Non dominant left vertebral artery arises directly from the arch, its origin is affected by the moderate arch plaque, and moderately to severely stenotic. 6. Emphysema. Study discussed by telephone with NP [name] on 09/24/2013 at 12:29 .   Electronically Signed   By: Augusto Gamble M.D.   On: 09/24/2013 12:31   Ct Angio Neck W/cm &/or Wo/cm  09/24/2013   CLINICAL DATA:  73 year old male status post tPA and right MCA embolectomy. Left hemiplegia. Initial encounter.  EXAM: CT ANGIOGRAPHY HEAD AND NECK  TECHNIQUE: Multidetector CT imaging of the head and neck was performed using the standard protocol during bolus administration of intravenous contrast. Multiplanar CT image reconstructions including MIPs were obtained to evaluate the vascular anatomy. Carotid stenosis measurements (when applicable) are obtained utilizing NASCET criteria, using the distal internal carotid diameter as the denominator.  CONTRAST:  50mL OMNIPAQUE IOHEXOL 350  MG/ML SOLN  COMPARISON:  Post treatment head CT 09/23/2013. Cerebral angiogram 09/22/2013.  FINDINGS: CTA HEAD FINDINGS  Progressed and severe hypodensity affecting the right hemisphere in the right MCA territory, but with cingulate and vertex ACA territory involvement as before. Effaced right lateral ventricle. Leftward midline shift has progressed to 13 mm (previously 6 mm). No acute intracranial hemorrhage identified. Stable gray-white matter differentiation in the left hemisphere. Posterior fossa gray-white matter differentiation remains normal in the basilar cisterns remain patent. No abnormal enhancement identified.  VASCULAR FINDINGS:  Dominant distal right vertebral artery is normal. Normal right PICA origin. Non dominant left vertebral artery terminates in PICA. The basilar artery is diminutive, with patent SCA origins and fetal type bilateral PCA origins. Bilateral PCA branches are normal.  Both ICA siphons are patent. Suspect soft and calcified plaque in the left siphon. No hemodynamically significant stenosis. Normal ophthalmic and posterior communicating artery origins. Patent carotid termini.  Normal ACA origins (the right is dominant) and left MCA origin. The right ACA remains somewhat dominant throughout. Bilateral ACA branches are within normal limits except for mass effect from right hemisphere edema. Left MCA branches are within normal limits.  Right MCA origin is patent. There is asymmetrically increased venous contrast about the right M1 segment. The  right M1 segment is patent without stenosis. The right MCA bifurcation is patent. Right MCA branches appear normal except for mass effect from the right cerebral edema. No major branch occlusion identified.  Review of the MIP images confirms the above findings.  CTA NECK FINDINGS  Endotracheal tube tip above the carina. Oral enteric tube in place. Emphysema. No superior mediastinal lymphadenopathy. Negative thyroid, nasopharynx, parapharyngeal  spaces, retropharyngeal space, sublingual space, submandibular glands, parotid glands, and orbits soft tissues. No cervical lymphadenopathy. Mild sphenoid sinus opacification. No acute osseous abnormality identified.  VASCULAR FINDINGS: Moderate aortic arch atherosclerosis, calcified and soft plaque. No stenosis at the brachiocephalic artery origin or right CCA origin. Right CCA within normal limits. Calcified plaque at the right carotid bifurcation. No hemodynamically significant stenosis results. Calcified plaque at the anterior right ICA just be on the bulb, no stenosis. Dolichoectatic right ICA, with a kinked appearance just be on this level. Otherwise negative right ICA to the skullbase.  No proximal right subclavian stenosis. Right vertebral artery origin bordered by plaque but within normal limits. Dominant right vertebral artery, otherwise normal to the skullbase.  Soft and calcified plaque at the left CCA origin resulting in less than 50 % with respect to the distal vessel. Intermittent soft and calcified plaque in the left CCA without stenosis. Calcified plaque at the left carotid bifurcation and in the anterior and medial left ICA bulb. Subsequent stenosis less than 50 % with respect to the distal vessel. Tortuous mid cervical left ICA with a kinked appearance. Otherwise in normal left ICA to the skullbase.  Non dominant left vertebral artery arises directly from the arch. There is plaque at its origin which appears moderately to severely stenotic (series 40981 image 101). The left vertebral artery is otherwise patent and within normal limits throughout the neck.  Review of the MIP images confirms the above findings.  IMPRESSION: 1. Progressed right hemisphere cytotoxic edema with confluent right MCA, and right ACA, territory involvement. 2. No hemorrhagic transformation. Increased leftward midline shift to 13 mm (previously 6 mm). 3. Right anterior circulation is patent without focal irregularity,  stenosis, or major branch occlusion identified. There is mass effect on the right MCA and ACA vessels. 4. Neck CTA with bilateral carotid plaque and tortuosity but no hemodynamically significant carotid stenosis. 5. Non dominant left vertebral artery arises directly from the arch, its origin is affected by the moderate arch plaque, and moderately to severely stenotic. 6. Emphysema. Study discussed by telephone with NP [name] on 09/24/2013 at 12:29 .   Electronically Signed   By: Augusto Gamble M.D.   On: 09/24/2013 12:31   Time: 50 minutes Greater than 50%  of this time was spent counseling and coordinating care related to the above assessment and plan.  Signed by: Edsel Petrin, DO  09/11/2013, 10:28 AM  Please contact Palliative Medicine Team phone at [phone] for questions and concerns.

## 2013-09-27 DIAGNOSIS — I6350 Cerebral infarction due to unspecified occlusion or stenosis of unspecified cerebral artery: Principal | ICD-10-CM

## 2013-09-27 DIAGNOSIS — Z515 Encounter for palliative care: Secondary | ICD-10-CM

## 2013-10-11 NOTE — Discharge Summary (Signed)
Death Summary  Tora PerchesWillard S Walstad ZOX:[id number] DOB: 05/29/1941 DOA: June 17, 2014  PCP: Default, Provider, MD PCP/Office notified: yes  Admit date: June 17, 2014 Date of Death: 09/25/2013  Final Diagnoses:  Principal Problem:   CVA (cerebral infarction) Active Problems:   Acute respiratory failure   Altered mental status   Palliative care patient   DNR (do not resuscitate)  Hospital Course:  Tora PerchesWillard S Allcorn was a 73 y.o. man with a history of hypertension and diabetes mellitus who was admitted on 09/22/2013 after acute onset slurred speech. No previous history of stroke nor TIA. CT scan of his head obtain in ED showed density involving the right proximal middle cerebral artery. 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 NIH stroke score was 19. Patient was deemed a candidate for intravenous TPA which was administered. He was admitted to the neuro ICU for further evaluation and treatment but subsequently had worsening of his mental status and repeat imaging showed cytotoxic cerebral edema and midline shift. He required intubation and developed VDRF complications. Given very poor neurological findings he was extubated and transitioned to comfort care. He was admitted under [facility] for management of his dyspnea.  He expired on 10/03/2013 at 1550. I evaluated patient post-mortem and confirmed with RN that patient was comfortable and family had all of their questions answered. I completed the death certificate and placed with his body for post-mortem transport.  Time: 30 minutes. I attended at the bedside following his death and confirmed RN pronouncement.  SignedAnderson Malta:  Mihika Surrette  [facility] 10/09/2013, 6:04 PM

## 2013-10-11 NOTE — Progress Notes (Signed)
Chaplain paged to provide support to family. Chaplain noticed several family members at bedside of patient. Chaplain comforted the patients family and aided them mourning over their loss.   09/24/2013 1620  Clinical Encounter Type  Visited With Family  Visit Type Initial;Spiritual support;Social support;Death  Referral From Nurse  Stress Factors  Family Stress Factors Loss

## 2013-10-11 NOTE — H&P (Addendum)
Patient expired before complete History and Physical exam could be completed [facility]. Medicare does not require new H&P for [facility] ([facility]) admission/re-admission. Death summary completed.  Anderson MaltaElizabeth Sailor Hevia, DO Palliative Medicine

## 2013-10-11 NOTE — Progress Notes (Signed)
Wasted 25 cc Dilaudid IV in sink. Malen Gauzearol Elder Davidian, RN

## 2013-10-11 NOTE — Progress Notes (Signed)
Death occurred during during bedbath. No resps, no pulse, no bp. TOD 1550. Pronounced by Malen Gauzearol Cambrie Sonnenfeld, RN and Launa FlightJoAnn Hamze, RN. Randy Taylor. Terence Googe, RN

## 2013-10-11 DEATH — deceased

## 2013-11-08 DEATH — deceased

## 2014-12-28 NOTE — Consult Note (Signed)
Pt seen, Chart Reviewed, Note Dictated  Assesment:  Urinary Retention, Benign Prostatic Hypertrophy  Rec:   With the bladder volume. leave foley cath for 1 week.  Plan on discharge with foley catheter.  Will enter order for foley cath teaching and care. Start Flomax 0.4mg  daily with meal. Pt having voiding issues for some time prior to admission. May need eventual cystoscopy for further evaluation of the prostate. Will schedule follow up for Am catheter removal and Pm bladder scan.  Contact us with any further questions.  Electronic Signatures: Smith Robertope, Shriley Joffe S (MD)  (Signed on 31-Jul-13 17:55)  Authored  Last Updated: 31-Jul-13 17:55 by Smith Robertope, Zohal Reny S (MD)

## 2014-12-28 NOTE — Op Note (Signed)
PATIENT NAME:  [name], Randy S MR#:  [id number] DATE OF BIRTH:  1941/02/04  DATE OF PROCEDURE:  04/08/2012  PREOPERATIVE DIAGNOSIS: Acute cholecystitis with cholelithiasis.   POSTOPERATIVE DIAGNOSIS: Acute cholecystitis with cholelithiasis.   PROCEDURE PERFORMED: Attempted laparoscopic cholecystectomy and then open cholecystectomy.   SURGEON: Kezia Benevides S. Stormey Wilborn, MD  INDICATIONS FOR SURGERY: This patient was seen by me with right upper quadrant abdominal pain. He has a history of chronic obstructive pulmonary disease. He was hurting a lot in the right upper quadrant but he had some kind of surgery in the abdomen with a mesh put in the epigastric region so I told him there was a chance of being opened up.  DESCRIPTION OF PROCEDURE: He was brought to surgery under general anesthesia, the abdomen was then prepped and draped. We tried to open him in the midline but there was a mesh on top of it and small bowel was stuck to it so I could not enter from that side. I tried to enter from the right side where I could not enter from that side either so decided to open him up. After this a right subcostal incision was given. After cutting skin and subcutaneous tissue, the fascia was then cut. The abdomen was then entered. After we entered the abdomen, we released all the adhesions down and put  large sponge in the abdominal cavity and with a Hartmann retractor exposed the gallbladder. Gallbladder was stuck with all the adhesions. All the adhesions were then lysed down. Gallbladder was then released. Patient had a very large stone dissected from behind all the way down to the cystic artery, which was then clipped and cut. The cystic duct was then dissected off. It was then tied off and gallbladder was then removed. Patient had a very large single gallstone. There was no bleeding in the liver bed. Irrigation of the liver bed was performed and then peritoneum was closed with running Vicryl, fascia was closed with  interrupted 0 Vicryl sutures. The umbilical port was also closed with 0 Vicryl sutures and the lateral port was also closed with 0 Vicryl sutures. Patient tolerated procedure well, sent to recovery room in satisfactory condition.  ____________________________ Alton RevereMasud S. Cecelia ByarsHashmi, MD msh:cms D: 04/08/2012 13:37:29 ET T: 04/08/2012 14:46:06 ET  JOB#: [id number] Khrystyne Arpin S Randa Riss MD ELECTRONICALLY SIGNED 04/08/2012 15:07

## 2014-12-28 NOTE — Consult Note (Signed)
PATIENT NAME:  Randy Taylor, Randy Taylor MR#:  [id number] DATE OF BIRTH:  Jun 02, 1941  DATE OF CONSULTATION:  04/09/2012  REFERRING PHYSICIAN:   CONSULTING PHYSICIAN:  Madolyn FriezeBrian Taylor. Achilles Dunkope, MD  HISTORY: Mr. Randy Taylor is a 74 year old gentleman who presented with acute cholecystitis. He underwent an open cholecystectomy by Dr. Cecelia ByarsHashmi. He was unable to void after the procedure. A Foley catheter was placed with approximately 400 mL residual noted. He related a significant history of bladder outlet obstructive symptoms. Prior to presentation he noted significant decreased force of stream with start and stop voiding as well as postvoid dribble. He related 2 to 3 times nocturia. He did have episodes of urge incontinence. He had had no prior evaluation for prostatic hypertrophy. Consultation was requested for this purpose.   PAST MEDICAL HISTORY:  1. Diabetes mellitus.  2. Chronic obstructive pulmonary disease. 3. Esophageal reflux. 4. Hypertension. 5. Gout. 6. Benign prostatic hypertrophy   PAST SURGICAL HISTORY: Significant for laparoscopic cholecystectomy converted to open.   SOCIAL HISTORY: The patient has a history of tobacco use. He denies any significant alcohol or drug use.   MEDICATIONS ON ADMISSION:  1. Spiriva 1 inhalation daily.  2. Simvastatin 20 mg once daily. 3. Qvar 1 puff 2 times daily.  4. Omeprazole 20 mg once daily. 5. Metoprolol 25 mg 1 tablet daily.  6. Metformin 500 mg once daily. 7. HCTZ 25 mg once daily. 8. Gabapentin 400 mg 3 times daily.  9. ASA 81 mg. 10. Amlodipine/benazepril 10 mg/20 one capsule daily. 11. Advair Diskus 1 puff 2 times daily as needed.   ALLERGIES: No known drug allergies.   PHYSICAL EXAMINATION:   VITAL SIGNS: Vital signs stable.   HEENT: Within normal limits.   CHEST: Clear to auscultation bilaterally.   CARDIOVASCULAR: Regular rate and rhythm.   ABDOMEN: Soft, nontender, nondistended. No palpable masses. Tender along the incision site.   GU:  External genitalia demonstrates a Foley catheter in place draining clear yellow urine. No gross penile, scrotal, or testicular abnormalities are appreciated.   EXTREMITIES: Free range of motion x4.   NEUROLOGIC: Motor and sensory grossly intact.   ASSESSMENT:  1. Urinary retention.  2. Benign prostatic hypertrophy.   RECOMMENDATION:  1. The patient should have the Foley catheter for at least one week.  2. Start Flomax 0.4 mg daily.  3. We will arrange follow-up in approximately one week for Foley catheter removal and voiding trial. He will need eventual cystoscopy for further evaluation of his bladder and prostate. 4. If there are any further issues during hospitalization, please free to contact us. We will enter orders for catheter teaching with leg bag and bedside drainage bag.  ____________________________ Madolyn FriezeBrian Taylor. Achilles Dunkope, MD bsc:drc D: 04/18/2012 15:32:11 ET T: 04/18/2012 16:21:53 ET JOB#: [id number] Janina Trafton Taylor Renia Mikelson MD ELECTRONICALLY SIGNED 04/29/2012 7:16

## 2015-04-11 IMAGING — XA IR THROMBECTOMY MECHANICAL PRIMARY INIT
1 series · 9 of 24 positions shown · IV contrast (IODINE)
Comparison: none

CLINICAL DATA: Acute onset of left-sided hemiplegia, right sided
gaze deviation and dysarthria.

[Series 300: neuro · 9 of 174 slices shown]
[im 8/174]
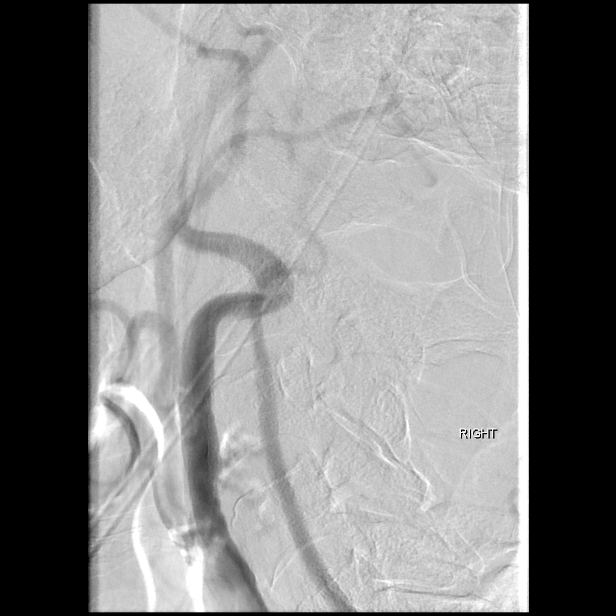
[im 31/174]
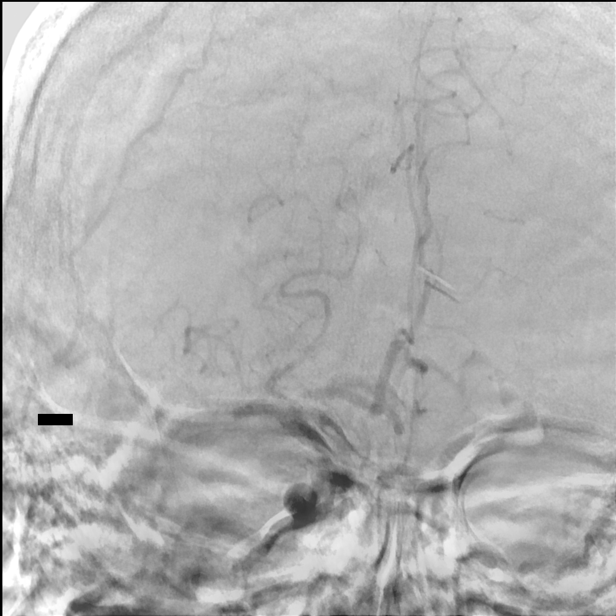
[im 46/174]
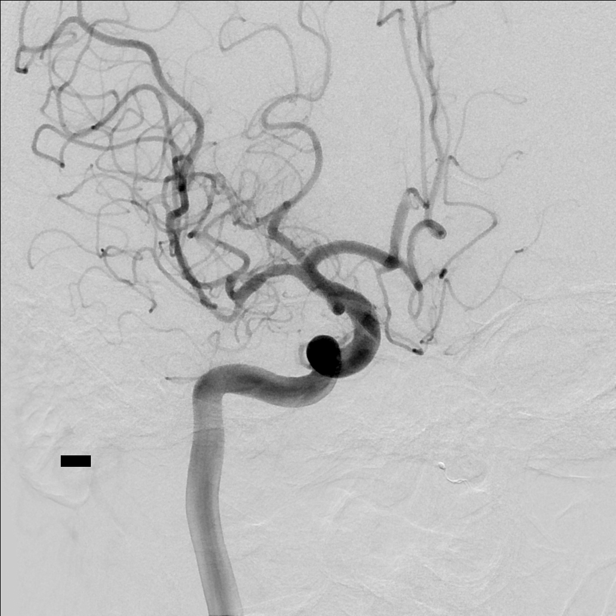
[im 68/174]
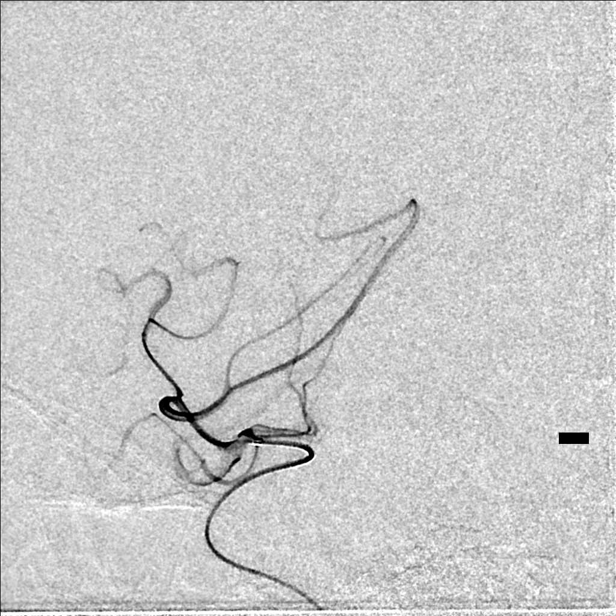
[im 91/174]
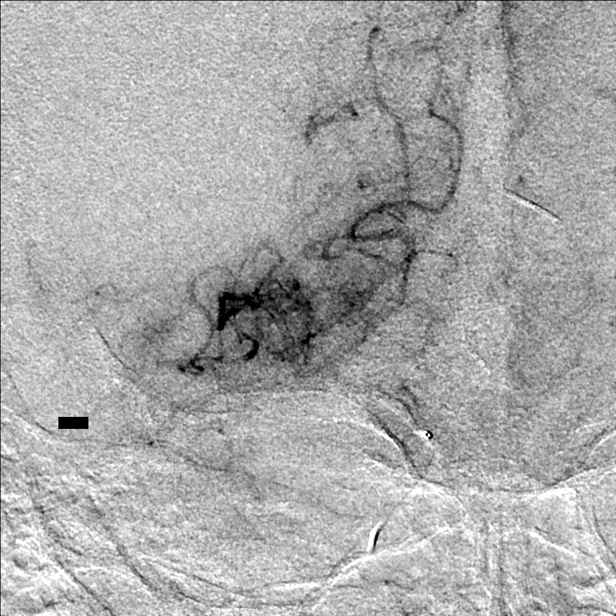
[im 106/174]
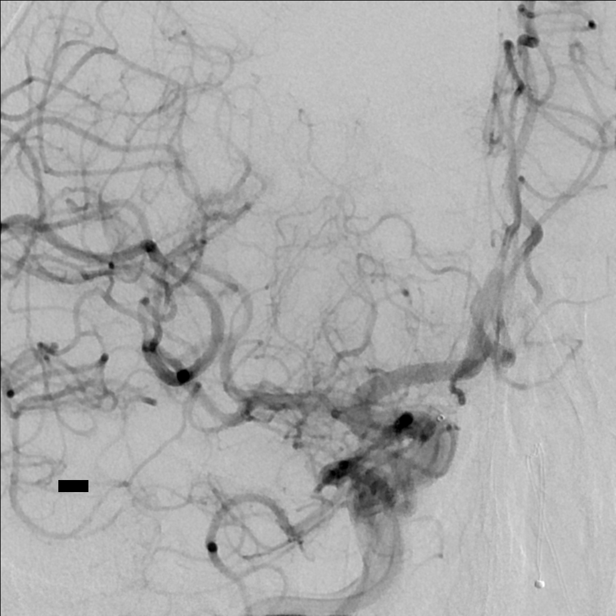
[im 128/174]
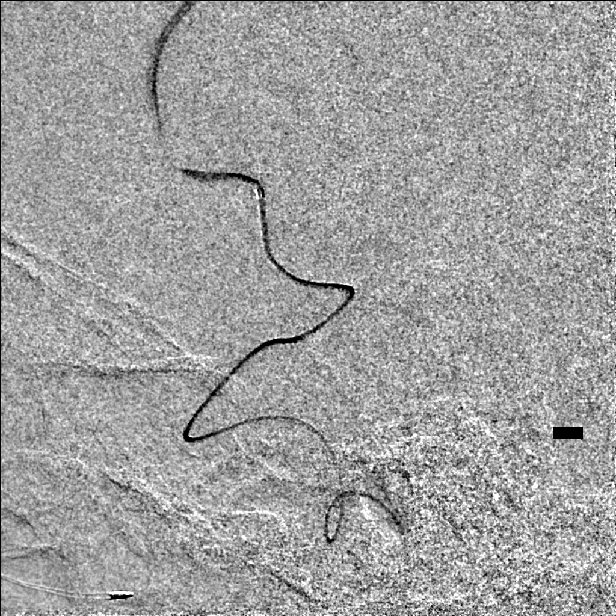
[im 151/174]
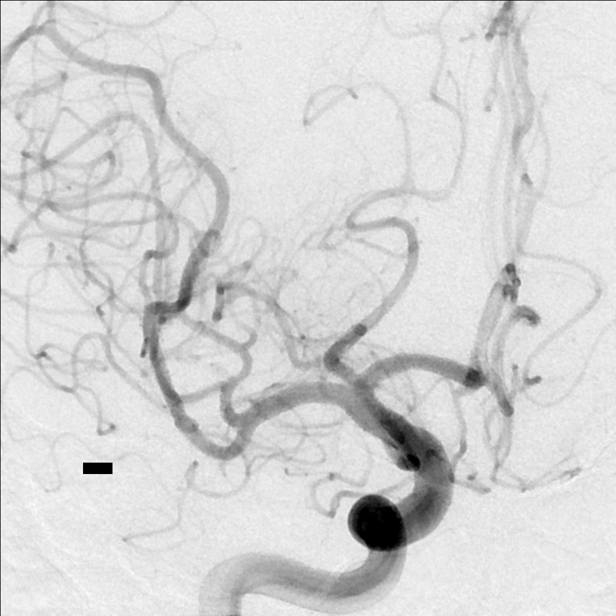
[im 166/174]
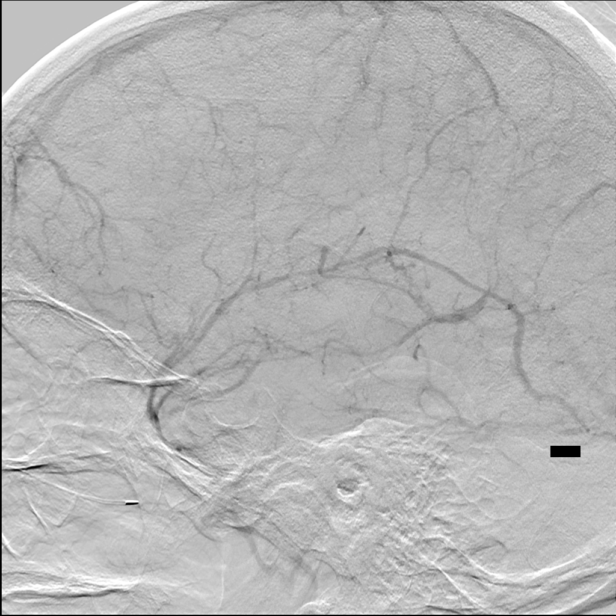

[9 of 24 positions shown; findings below may reference images not displayed]

EXAM:
RIGHT COMMON CAROTID ARTERIOGRAM AND RIGHT VERTEBRAL ARTERY
ANGIOGRAM FOLLOWED BY ENDOVASCULAR COMPLETE REVASCULARIZATION OF
OCCLUDED RIGHT MIDDLE CEREBRAL ARTERY M1 SEGMENT AND RIGHT ANTERIOR
CEREBRAL ARTERY USING MECHANICAL THROMBECTOMY MECHANICAL AND
SUPERSELECTIVE INTRACRANIAL INTRA-ARTERIAL tPA INFUSION OF
APPROXIMATELY 13.4 MG:

MEDICATIONS:
As per anesthesia service.

ANESTHESIA/SEDATION:
General anesthesia.

CONTRAST:  140mL OMNIPAQUE IOHEXOL 300 MG/ML  SOLN

PROCEDURE:
Following a full explanation of the procedure along with the
potential associated complications, an informed witnessed consent
was obtained from the patient's daughter.

The right groin was prepped and draped in the usual sterile fashion.
Thereafter using modified Seldinger technique, transfemoral access
into the right common femoral artery was obtained without
difficulty. Over a 0.035 inch guidewire, a 5 French Pinnacle sheath
was inserted. Through this and also over a 0.035 inch guidewire, a 5
French JB1 catheter was advanced to the aortic arch region and
selectively positioned in the right subclavian artery and right
common carotid artery.

Arteriograms were then perfor[REDACTED]ed extracranially and
intracranially. The patient tolerated the procedure well.

COMPLICATIONS:
None immediate.
FINDINGS: The right subclavian arteriogram demonstrates the right vertebral
artery origin to be normal.

The vessel opacifies normally to thecranial skull base.

There is normal opacification of the right posterior inferior
cerebellar artery and the right vertebrobasilar junction.

The opacified portions of the basilar artery, the superior
cerebellar arteries and the anterior inferior cerebellar arteries is
grossly normal into the delayed arterial phase.

The right common carotid arteriogram demonstrates the right external
carotid artery and its major branches to be widely patent.

The right internal carotid artery at the bulb has a smooth shallow
plaque along the posterior wall.

There is mild tortuosity of the mid cervical right ICA.

More distally the right internal carotid artery is seen to opacify
normally to the cranial skull base.

The petrous, the cavernous and the supraclinoid segments are widely
patent.

The right posterior communicating artery is seen opacifying the
right posterior cerebral artery distribution.

The right anterior cerebral artery proximally is widely patent.

There is cross filling via the anterior communicating artery of the
left anterior cerebral artery A2 segment and distally.

There is occlusion of the right anterior cerebral artery at the A2
segment.

The right middle cerebral artery demonstrates complete angiographic
occlusion of the M1 segment.

There is minimal distal leptomeningeal collateralization of the
right middle cerebral artery distribution or of the right anterior
cerebral artery.

Endovascular revascularization of the occluded right middle cerebral
artery and the right anterior cerebral artery A2 segment using
mechanical thrombectomy device, and super selective intracranial
intra-arterial tPA as described.

The angiographic findings were reviewed with the referring
neurologist. The option of endovascular revascularization to prevent
further neurological injury and to potentially aid in subsequent
neurological recovery was discussed with the patient's daughter.

The risks of intracranial hemorrhage of 10-15%, worsening
neurological deficit, ventilator dependency, death, and inability to
revascularized were all reviewed in detail.

After informed consent, the patient was put under general anesthesia
by the [REDACTED] at [HOSPITAL].

The diagnostic JB1 catheter in the right common carotid artery was
then exchanged for a 0.035 inch 300 cm Rosen exchange guidewire for
an 8 French 55 cm Brite tip neurovascular sheath using biplane
roadmap technique and constant fluoroscopic guidance. Good
aspiration was obtained from the side-port of the neurovascular
sheath. A gentle contrast injection demonstrated no evidence of
spasm, dissections or of intraluminal filling defects . This was
then connected to continuous heparinized saline infusion.

Over the Rosen exchange guidewire, an 8 French 95 cm Merci balloon
guide catheter which had been prepped and purged with 75% contrast
and 25% heparinized saline infusion was then advanced and positioned
just proximal to the right common carotid artery bifurcation. The
guidewire was removed. Good aspiration was obtained from the hub of
the 8 French Merci guide catheter. A gentle contrast injection
demonstrated no evidence of spasm, dissections or of intraluminal
filling defects.

Over a 0.035 inch Roadrunner guidewire, and biplane roadmap
technique, the 8 French guide catheter was then advanced to the
junction of the middle and the anterior one-third of the right
internal carotid artery. The guidewire was removed. Again good
aspiration was obtained from the hub of the 8 French Merci guide
catheter. A gentle contrast injection demonstrated no evidence of
spasm, dissections or of intraluminal filling defects.

A control arteriogram performed through the 8 French Merci guide
catheter in the right internal carotid artery demonstrated no change
in the intracranial circulation.

At this time a combination of a DAC 125 catheter with a 18L Merci
micro catheter combination was advanced over a 0.014 inch Softtip
Synchro micro guidewire to the distal end of the guide catheter in
the right internal carotid artery.

With the micro guidewire leading with a J-tip configuration, the
combination was navigated without difficulty to the supraclinoid
right ICA.

The micro guidewire was then gently advanced past the occluded right
middle cerebral artery into the inferior division of the right
middle cerebral artery M3 region followed by the micro catheter. The
micro guidewire was then removed. Good aspiration was obtained from
the hub of the micro catheter. A gentle control arteriogram
demonstrated brisk antegrade flow distally.

At this time 10 mg of superselective intracranial intra-arterial tPA
was then infused over approximately 10 minutes in 10 cc of normal
saline.

At the end of this a controlled arteriogram performed through the 8
French Merci guide catheter demonstrated no change in the proximal
M1 occlusion.

At this time, a 4 mm x 40 mm Solitaire FR device which had been
prepped with heparinized saline infusion in it's housing was then
advanced in a coaxial manner and with constant heparinized saline
infusion using biplane roadmap technique and constant fluoroscopic
guidance to the distal end of the 18L Merci micro catheter and the
right middle cerebral artery inferior division.

The O-ring on the delivery micro guidewire in the delivery micro
catheter were then released.

With slight forward gentle traction with the right hand on the
delivery micro guidewire, with the left hand the delivery micro
catheter was then gradually retrieved unsheathing the distal and
then the proximal portion of the endovascular retrieval device.

This was left deployed for approximately 5 minutes.

At the end of this, the proximal portion of the stent retrieval
device was then captured into the micro catheter.

The occlusion balloon in the right internal carotid artery was then
expanded with contrast for proximal flow arrest. Using a 60 mL
syringe for constant aspiration at the hub of the 8 French Merci
guide catheter, the combination of the retrieval device, the 18L
microcatheter and the DAC 125 micro catheter was then retrieved as
constant aspiration was continued throughout the process.

The aspiration was continued after removal of this combination and
also during and after the deflation of the flow arrest balloon in
the right internal carotid artery.

Back bleed was allowed for a few seconds.

A control arteriogram performed through the 8 French Merci guide
catheter in the right internal carotid artery demonstrated complete
angiographic revascularization of the right middle cerebral artery
distribution with patency of the right anterior cerebral artery
proximally and also of the right posterior cerebral artery.

Moderate spasm in the proximal right middle cerebral artery
responded to 25 mics of intra-arterial nitroglycerin.

A close inspection demonstrated filling defect in the proximal A2
segment of the dominant right anterior cerebral artery. This was
initially also noted on the pretreatment arteriogram.

Using biplane roadmap technique and constant fluoroscopic guidance,
in a coaxial manner and with constant heparinized saline perfusion,
the 18L Merci micro catheter was then advanced to the right A1
segment.

The micro guidewire was then gently manipulated and advanced into
the bulk of the clot in the A2 segment of the right anterior
cerebral artery. Approximately 3 mg of super- selective intracranial
intra-arterial tPA was then infused over approximately 5 minutes in
3 mL of normal saline.

At the end of this, a control arteriogram performed through the
micro catheter demonstrated complete recanalization of the right
anterior cerebral artery A2 segment and distally.

The micro catheter was then gently retrieved and removed.

A final control arteriogram performed through the 8 French Merci
guide catheter in the right internal carotid artery demonstrated a
complete revascularization of the right middle cerebral artery
distribution, the right anterior cerebral artery distribution and
with patency of the right posterior cerebral artery. The 8 French
Merci guide catheter and the 8 French neurovascular sheath were then
retrieved into the abdominal aorta and exchanged over a J-tip
guidewire for a 9 French Pinnacle sheath. This was then connected to
continuous heparinized saline infusion.

The patient was then transported to the CT scanner for
postprocedural CT scan of the brain.
IMPRESSION: Status post endovascular complete revascularization of the right
middle cerebral artery using approximately 10 mg of superselective
intracranial intra-arterial tPA, and one pass with the Solitaire FR
retrieval device.

Status post endovascular complete revascularization of the right
anterior cerebral artery A2 segment and distally using approximately
3 mg of superselective intracranial intra-arterial tPA infusion.

## 2015-04-11 IMAGING — CT CT HEAD W/O CM
1 series · 16 of 30 positions shown, 20 images · non-contrast
Comparison: None.

CLINICAL DATA: Left-sided weakness.

EXAM:
CT HEAD WITHOUT CONTRAST
TECHNIQUE: Contiguous axial images were obtained from the base of the skull
through the vertex without intravenous contrast.

[Series 2: head 5.0 h30s · axial · 0.39mm/px · z∈[-139,+6]mm · 16 of 33 slices shown, 20 images]
[im 2/33  brain]
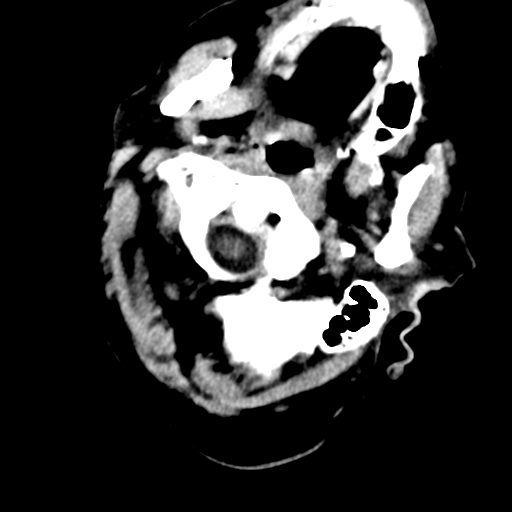
[im 2/33  bone]
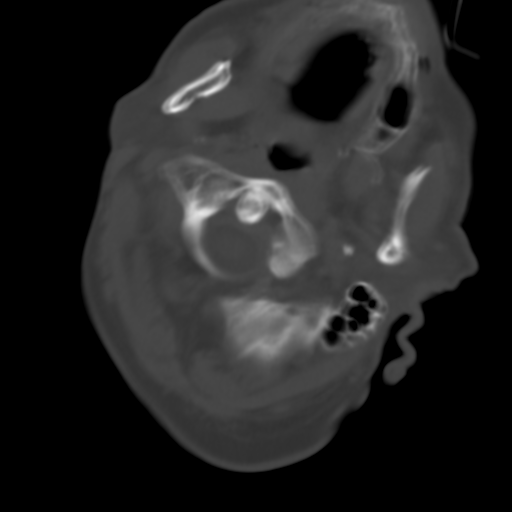
[im 4/33  brain]
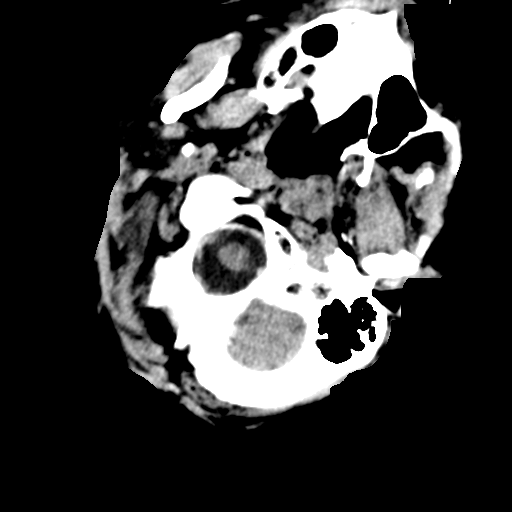
[im 6/33  brain]
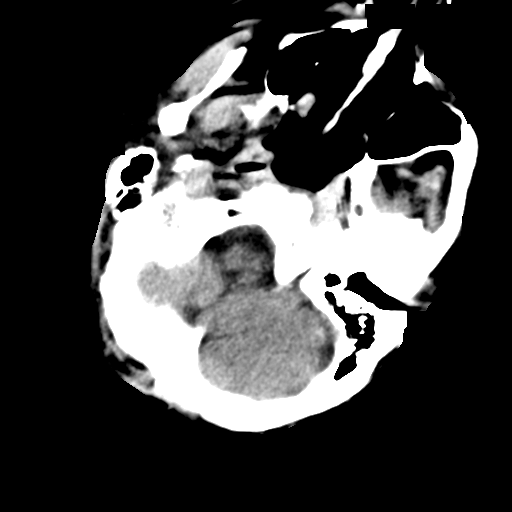
[im 8/33  brain]
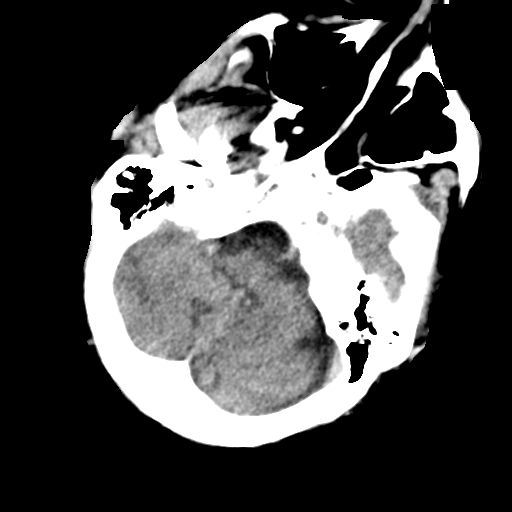
[im 9/33  brain]
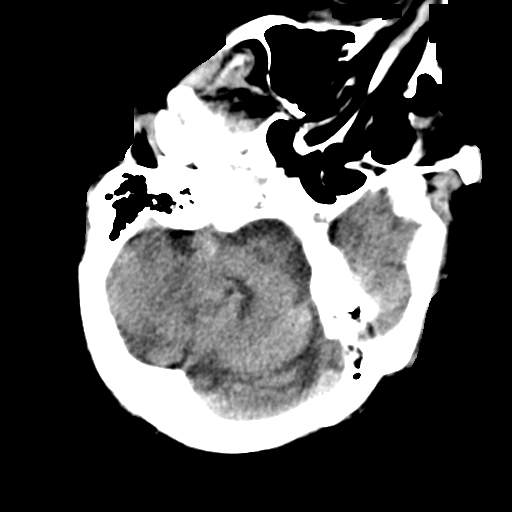
[im 9/33  bone]
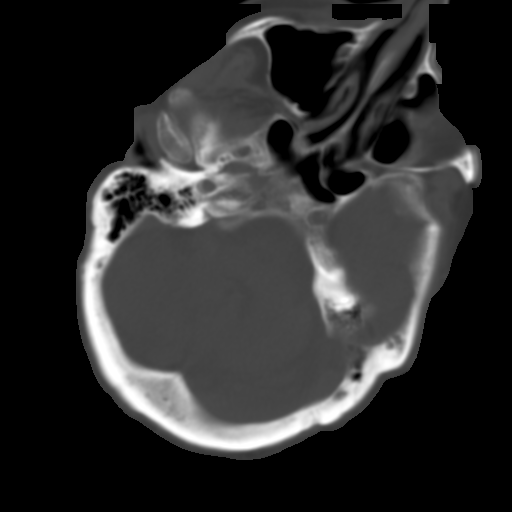
[im 12/33  brain]
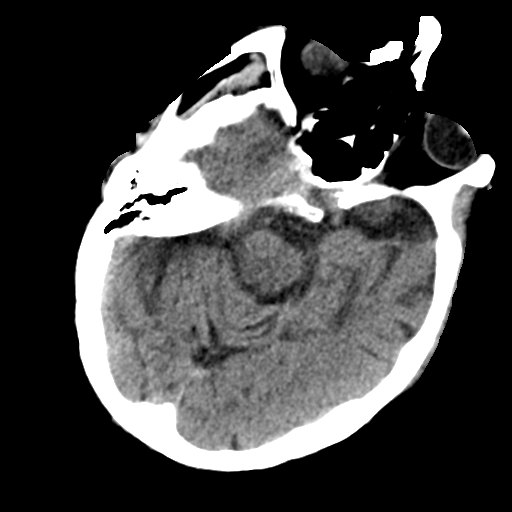
[im 14/33  brain]
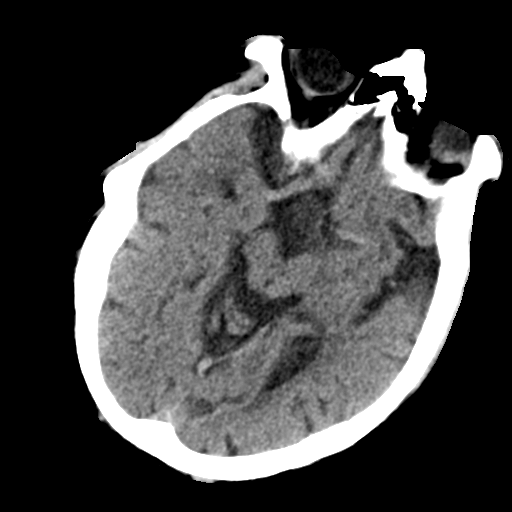
[im 16/33  brain]
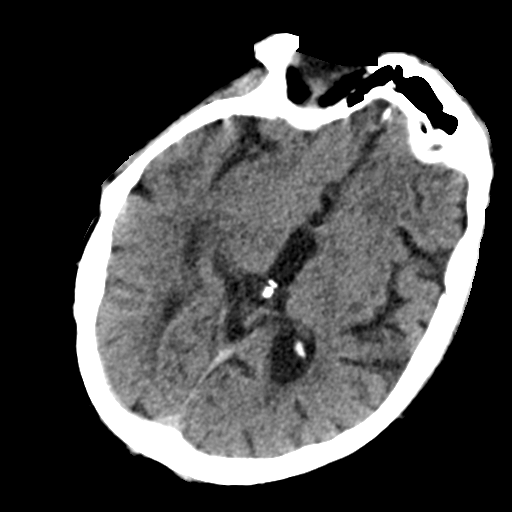
[im 17/33  brain]
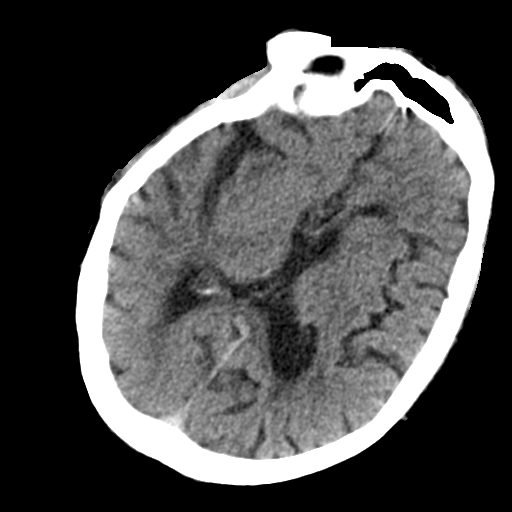
[im 17/33  bone]
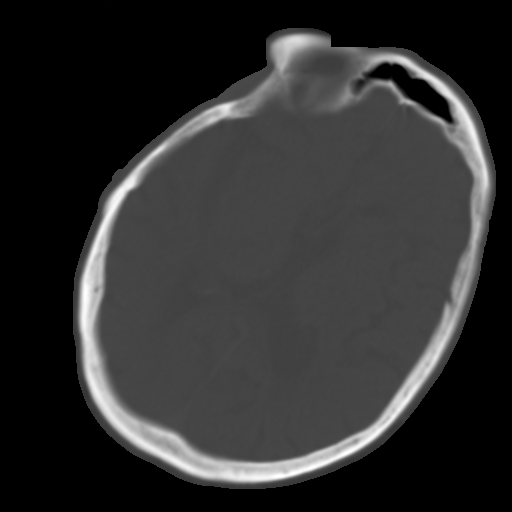
[im 19/33  brain]
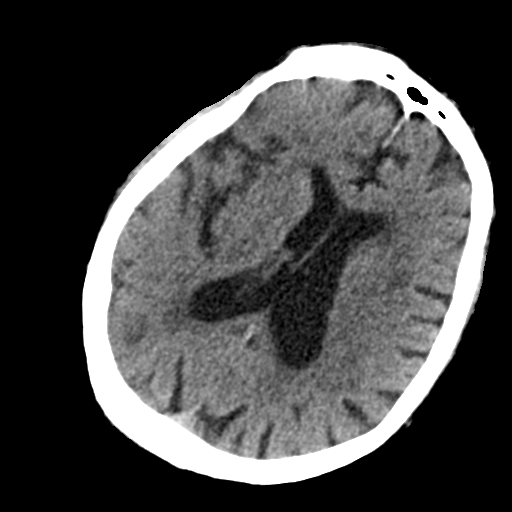
[im 21/33  brain]
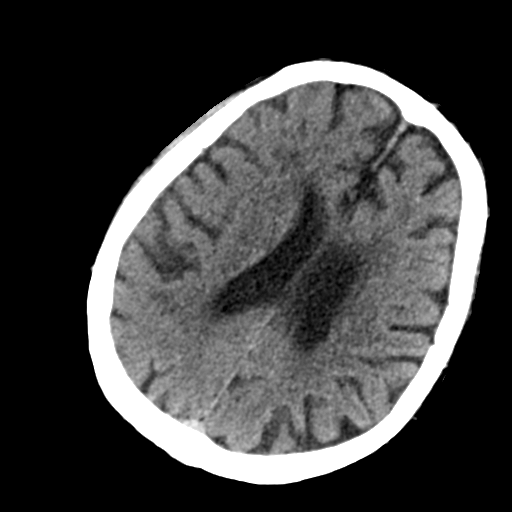
[im 24/33  brain]
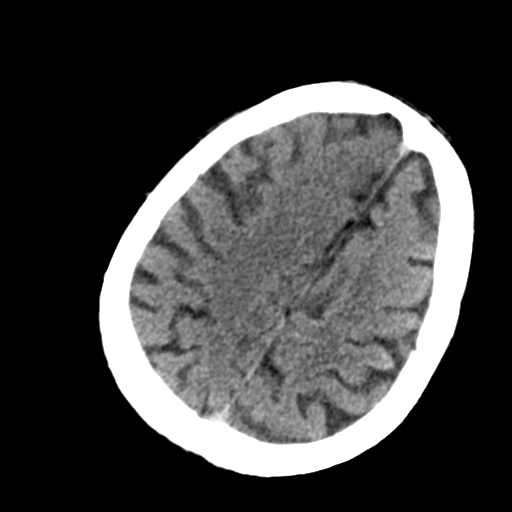
[im 25/33  brain]
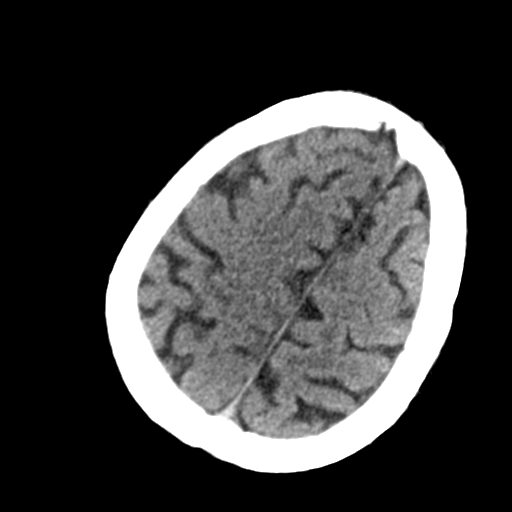
[im 25/33  bone]
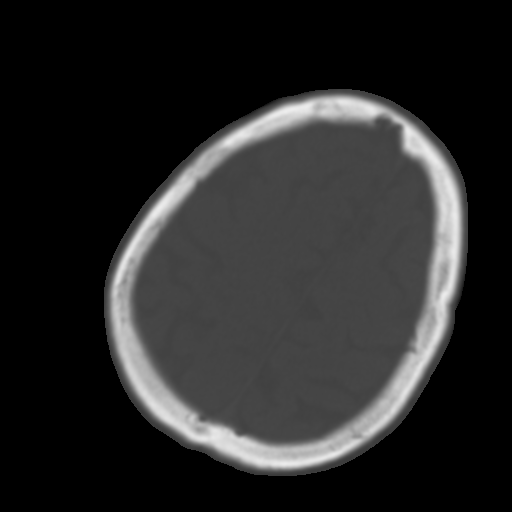
[im 27/33  brain]
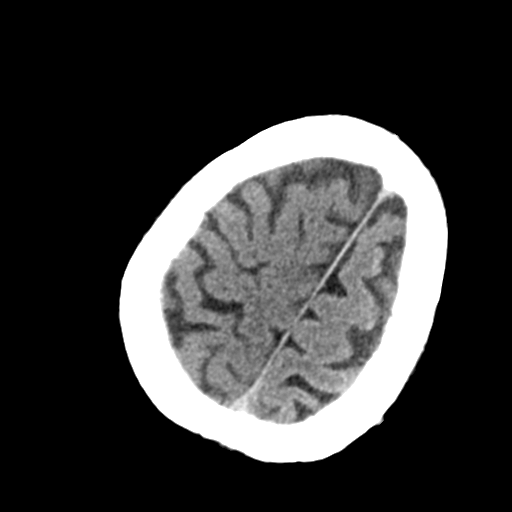
[im 29/33  brain]
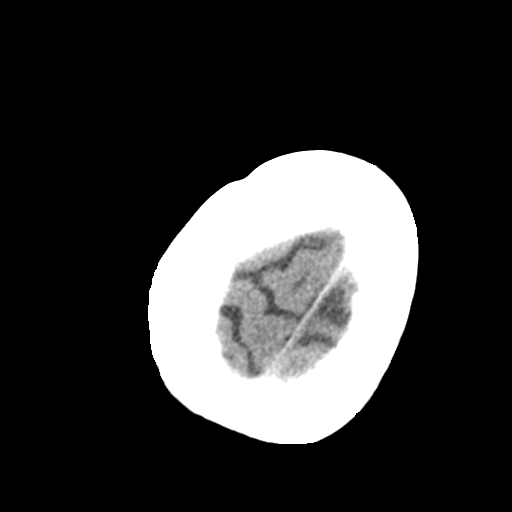
[im 31/33  brain]
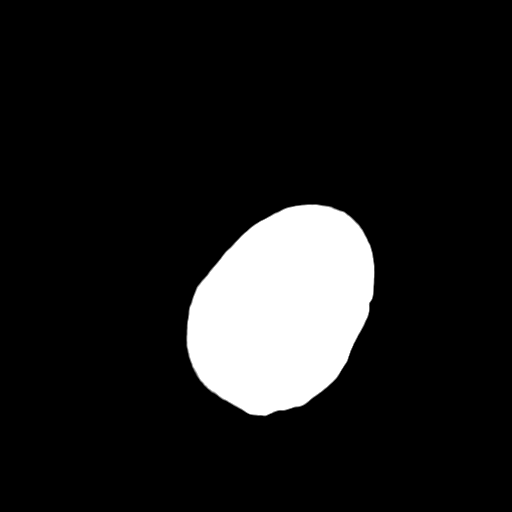

[16 of 30 positions shown; findings below may reference images not displayed]

FINDINGS: Slightly motion degraded exam.

No intracranial hemorrhage.

Dense appearance of the M1 segment of the right middle cerebral
artery. This may indicate thrombus or be normal.

Small vessel disease type changes.

Global atrophy without hydrocephalus.

No intracranial mass lesion noted on this unenhanced exam.
IMPRESSION: Slightly motion degraded exam.

No intracranial hemorrhage.

Dense appearance of the M1 segment of the right middle cerebral
artery. This may indicate thrombus or be normal (only minimally
different than the left side).

Small vessel disease type changes.

Global atrophy without hydrocephalus.

These results were called by telephone at the time of interpretation
on 09/22/2013 at [DATE] to Dr. Lorenz Jumper who verbally acknowledged
these results.

## 2015-04-12 IMAGING — CR DG CHEST 1V PORT
1 series · 1 of 1 positions shown · non-contrast
Comparison: 09/22/2013.

CLINICAL DATA: Intubation.

EXAM:
PORTABLE CHEST - 1 VIEW

[AP]
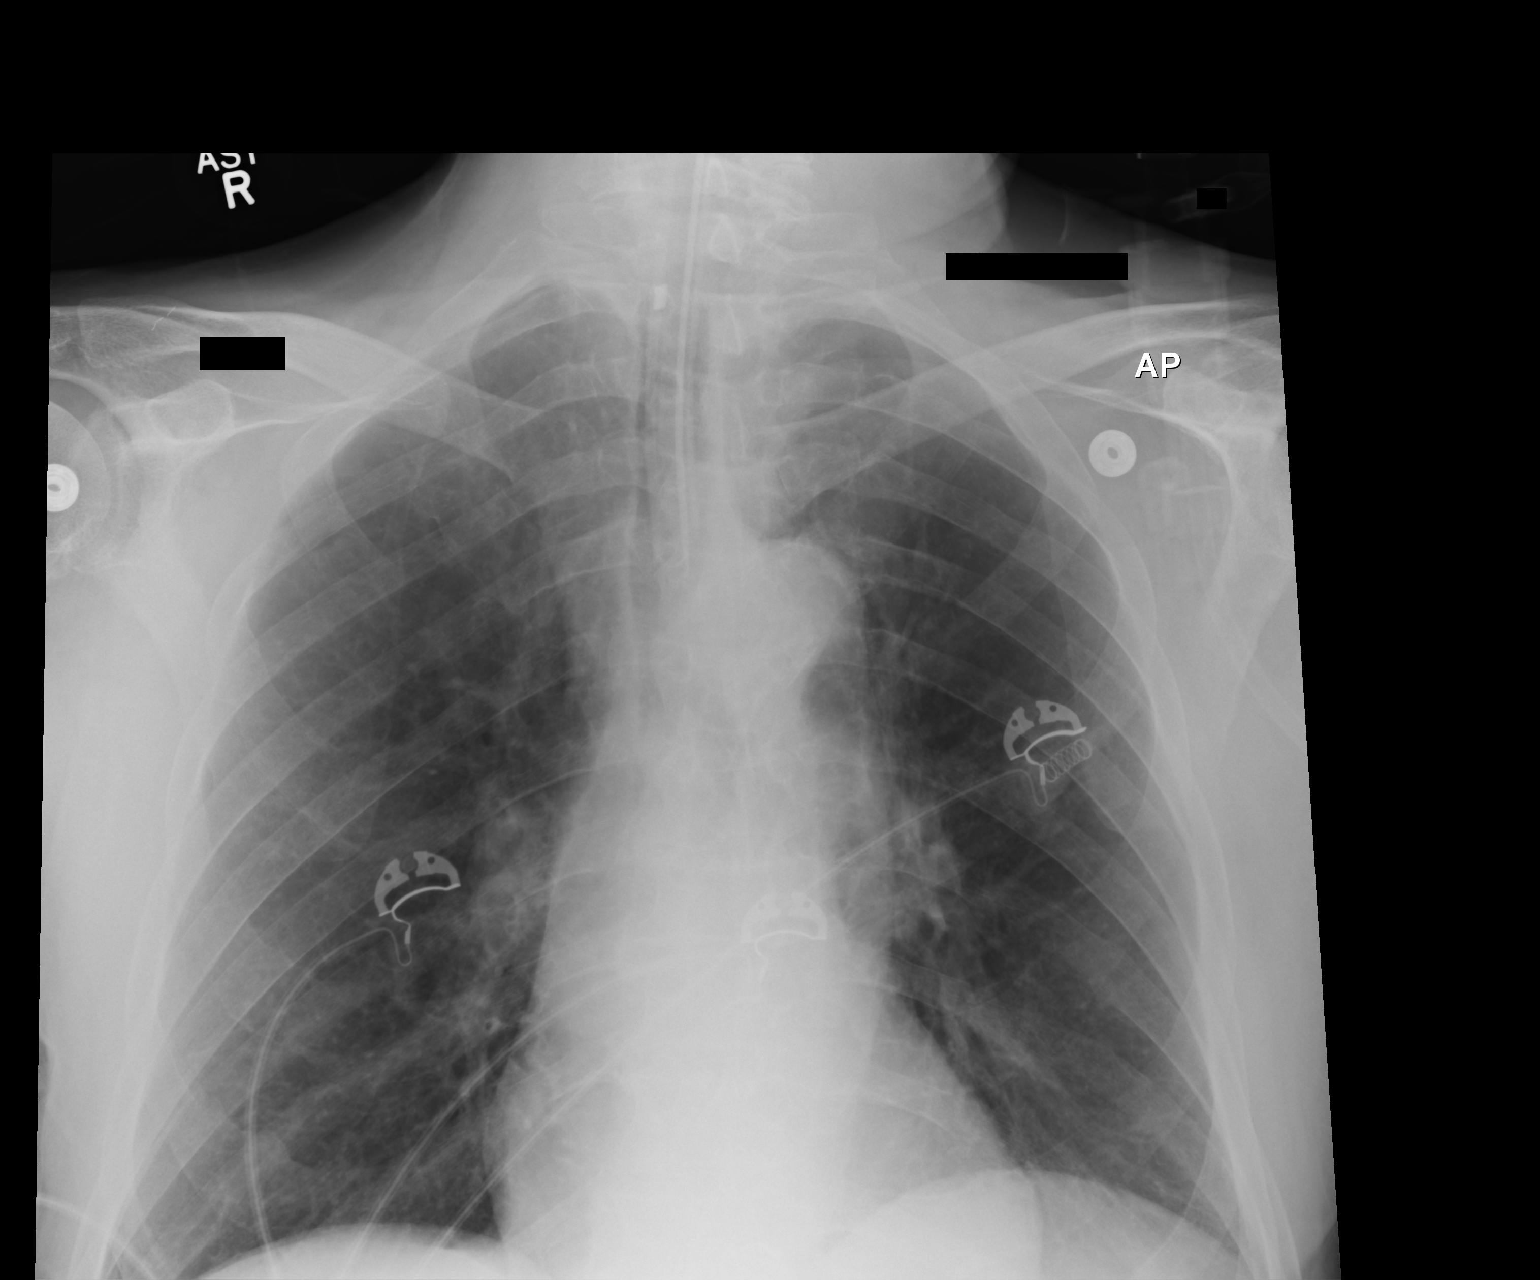

[1 of 1 positions shown; findings below may reference images not displayed]

FINDINGS: Endotracheal tube in stable position. Lungs are clear. COPD cannot
be excluded. Heart size normal. No pleural effusion or pneumothorax
identified. Lower most portions of the lung bases are not imaged.
Elevation left hemidiaphragm.
IMPRESSION: 1. Endotracheal tube in stable position.
2. No acute cardiopulmonary disease. COPD cannot be excluded. Stable
elevation left hemidiaphragm.
